# Patient Record
Sex: Male | Born: 1960
Health system: Southern US, Community
[De-identification: ages and names within clinical notes are randomized; demographics above are authoritative.]

## PROBLEM LIST (undated history)

## (undated) DIAGNOSIS — F329 Major depressive disorder, single episode, unspecified: Secondary | ICD-10-CM

## (undated) DIAGNOSIS — I10 Essential (primary) hypertension: Secondary | ICD-10-CM

## (undated) DIAGNOSIS — E785 Hyperlipidemia, unspecified: Secondary | ICD-10-CM

## (undated) DIAGNOSIS — H9190 Unspecified hearing loss, unspecified ear: Secondary | ICD-10-CM

## (undated) DIAGNOSIS — R972 Elevated prostate specific antigen [PSA]: Secondary | ICD-10-CM

## (undated) DIAGNOSIS — F1910 Other psychoactive substance abuse, uncomplicated: Secondary | ICD-10-CM

## (undated) DIAGNOSIS — J309 Allergic rhinitis, unspecified: Secondary | ICD-10-CM

## (undated) DIAGNOSIS — D126 Benign neoplasm of colon, unspecified: Secondary | ICD-10-CM

## (undated) DIAGNOSIS — F1021 Alcohol dependence, in remission: Secondary | ICD-10-CM

## (undated) DIAGNOSIS — R740 Nonspecific elevation of levels of transaminase and lactic acid dehydrogenase [LDH]: Secondary | ICD-10-CM

## (undated) HISTORY — DX: Alcohol dependence, in remission: F10.21

## (undated) HISTORY — DX: Hyperlipidemia, unspecified: E78.5

## (undated) HISTORY — DX: Nonspecific elevation of levels of transaminase and lactic acid dehydrogenase (ldh): R74.0

## (undated) HISTORY — DX: Other psychoactive substance abuse, uncomplicated: F19.10

## (undated) HISTORY — DX: Benign neoplasm of colon, unspecified: D12.6

## (undated) HISTORY — PX: COLONOSCOPY: SHX174

## (undated) HISTORY — DX: Unspecified hearing loss, unspecified ear: H91.90

## (undated) HISTORY — DX: Essential (primary) hypertension: I10

## (undated) HISTORY — DX: Major depressive disorder, single episode, unspecified: F32.9

## (undated) HISTORY — PX: POLYPECTOMY: SHX149

## (undated) HISTORY — DX: Allergic rhinitis, unspecified: J30.9

## (undated) HISTORY — DX: Elevated prostate specific antigen (PSA): R97.20

## (undated) HISTORY — PX: FOOT SURGERY: SHX648

---
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
## 2006-02-03 ENCOUNTER — Ambulatory Visit: Payer: Self-pay | Admitting: Internal Medicine

## 2006-06-03 ENCOUNTER — Ambulatory Visit: Payer: Self-pay | Admitting: Internal Medicine

## 2006-06-06 ENCOUNTER — Ambulatory Visit: Payer: Self-pay | Admitting: Internal Medicine

## 2006-06-16 ENCOUNTER — Ambulatory Visit: Payer: Self-pay | Admitting: Internal Medicine

## 2006-07-11 ENCOUNTER — Ambulatory Visit: Payer: Self-pay | Admitting: Internal Medicine

## 2006-09-25 ENCOUNTER — Ambulatory Visit: Payer: Self-pay | Admitting: Internal Medicine

## 2007-03-13 ENCOUNTER — Ambulatory Visit: Payer: Self-pay | Admitting: Internal Medicine

## 2007-03-13 LAB — CONVERTED CEMR LAB
ALT: 31 units/L (ref 0–40)
Direct LDL: 129.3 mg/dL
GFR calc non Af Amer: 77 mL/min
Glucose, Bld: 99 mg/dL (ref 70–99)
Total CHOL/HDL Ratio: 5.3
Triglycerides: 153 mg/dL — ABNORMAL HIGH (ref 0–149)

## 2007-05-22 ENCOUNTER — Ambulatory Visit: Payer: Self-pay | Admitting: Internal Medicine

## 2007-06-11 ENCOUNTER — Ambulatory Visit: Payer: Self-pay

## 2007-06-11 ENCOUNTER — Encounter: Payer: Self-pay | Admitting: Internal Medicine

## 2007-07-02 ENCOUNTER — Ambulatory Visit: Payer: Self-pay | Admitting: Internal Medicine

## 2007-07-02 LAB — CONVERTED CEMR LAB
AST: 26 units/L (ref 0–37)
BUN: 13 mg/dL (ref 6–23)
Chloride: 105 meq/L (ref 96–112)
Creatinine, Ser: 0.9 mg/dL (ref 0.4–1.5)
Direct LDL: 89.9 mg/dL
GFR calc Af Amer: 117 mL/min
HDL: 37.1 mg/dL — ABNORMAL LOW (ref >39.0)
Hgb A1c MFr Bld: 4.7 % (ref 4.6–6.0)
Sodium: 141 meq/L (ref 135–145)
T3, Free: 3.1 pg/mL (ref 2.3–4.2)
T4, Total: 7.2 ug/dL (ref 5.0–12.5)
TSH: 2.03 microintl units/mL (ref 0.35–5.50)
Total CHOL/HDL Ratio: 4.7
Triglycerides: 220 mg/dL (ref 0–149)
VLDL: 44 mg/dL — ABNORMAL HIGH (ref 0–40)

## 2007-07-28 DIAGNOSIS — F329 Major depressive disorder, single episode, unspecified: Secondary | ICD-10-CM

## 2007-07-28 DIAGNOSIS — F3289 Other specified depressive episodes: Secondary | ICD-10-CM

## 2007-07-28 DIAGNOSIS — F32A Depression, unspecified: Secondary | ICD-10-CM | POA: Insufficient documentation

## 2007-07-28 DIAGNOSIS — I10 Essential (primary) hypertension: Secondary | ICD-10-CM | POA: Insufficient documentation

## 2007-07-28 HISTORY — DX: Other specified depressive episodes: F32.89

## 2007-07-28 HISTORY — DX: Major depressive disorder, single episode, unspecified: F32.9

## 2007-07-28 HISTORY — DX: Essential (primary) hypertension: I10

## 2007-08-26 HISTORY — PX: OTHER SURGICAL HISTORY: SHX169

## 2007-08-29 ENCOUNTER — Emergency Department (HOSPITAL_COMMUNITY): Admission: EM | Admit: 2007-08-29 | Discharge: 2007-08-30 | Payer: Self-pay | Admitting: Emergency Medicine

## 2007-08-31 ENCOUNTER — Encounter: Payer: Self-pay | Admitting: Internal Medicine

## 2007-08-31 ENCOUNTER — Ambulatory Visit: Payer: Self-pay | Admitting: Internal Medicine

## 2007-09-03 ENCOUNTER — Encounter: Admission: RE | Admit: 2007-09-03 | Discharge: 2007-09-03 | Payer: Self-pay | Admitting: Internal Medicine

## 2007-09-11 ENCOUNTER — Ambulatory Visit: Payer: Self-pay | Admitting: Internal Medicine

## 2007-09-15 ENCOUNTER — Ambulatory Visit (HOSPITAL_BASED_OUTPATIENT_CLINIC_OR_DEPARTMENT_OTHER): Admission: RE | Admit: 2007-09-15 | Discharge: 2007-09-15 | Payer: Self-pay | Admitting: Orthopedic Surgery

## 2007-10-01 ENCOUNTER — Encounter: Payer: Self-pay | Admitting: Internal Medicine

## 2008-01-07 ENCOUNTER — Encounter: Payer: Self-pay | Admitting: Internal Medicine

## 2008-01-22 ENCOUNTER — Encounter: Payer: Self-pay | Admitting: Internal Medicine

## 2008-02-11 ENCOUNTER — Ambulatory Visit: Payer: Self-pay | Admitting: Internal Medicine

## 2008-02-11 DIAGNOSIS — E785 Hyperlipidemia, unspecified: Secondary | ICD-10-CM | POA: Insufficient documentation

## 2008-02-11 HISTORY — DX: Hyperlipidemia, unspecified: E78.5

## 2008-02-15 DIAGNOSIS — F1021 Alcohol dependence, in remission: Secondary | ICD-10-CM

## 2008-02-15 DIAGNOSIS — J309 Allergic rhinitis, unspecified: Secondary | ICD-10-CM

## 2008-02-15 HISTORY — DX: Allergic rhinitis, unspecified: J30.9

## 2008-02-15 HISTORY — DX: Alcohol dependence, in remission: F10.21

## 2008-04-15 ENCOUNTER — Ambulatory Visit: Payer: Self-pay | Admitting: Internal Medicine

## 2008-04-19 ENCOUNTER — Ambulatory Visit: Payer: Self-pay | Admitting: Internal Medicine

## 2008-04-19 LAB — CONVERTED CEMR LAB
ALT: 30 units/L (ref 0–53)
AST: 20 units/L (ref 0–37)
Cholesterol: 165 mg/dL (ref 0–200)
Creatinine, Ser: 0.9 mg/dL (ref 0.4–1.5)
Direct LDL: 88.3 mg/dL
GFR calc Af Amer: 116 mL/min
GFR calc non Af Amer: 96 mL/min
Potassium: 4.2 meq/L (ref 3.5–5.1)
Triglycerides: 204 mg/dL (ref 0–149)

## 2008-04-21 ENCOUNTER — Encounter: Payer: Self-pay | Admitting: Internal Medicine

## 2008-04-21 LAB — CONVERTED CEMR LAB: CRP, High Sensitivity: 5.2 — ABNORMAL HIGH

## 2008-04-25 ENCOUNTER — Encounter: Payer: Self-pay | Admitting: Internal Medicine

## 2008-08-05 ENCOUNTER — Ambulatory Visit: Payer: Self-pay | Admitting: Internal Medicine

## 2008-08-05 DIAGNOSIS — H9190 Unspecified hearing loss, unspecified ear: Secondary | ICD-10-CM | POA: Insufficient documentation

## 2008-08-05 HISTORY — DX: Unspecified hearing loss, unspecified ear: H91.90

## 2008-08-05 LAB — CONVERTED CEMR LAB
ALT: 39 U/L (ref 0–53)
AST: 25 U/L (ref 0–37)
Albumin: 4.2 g/dL (ref 3.5–5.2)
Alkaline Phosphatase: 72 U/L (ref 39–117)
Bilirubin, Direct: 0.1 mg/dL (ref 0.0–0.3)
Cholesterol: 121 mg/dL (ref 0–200)
Direct LDL: 65.4 mg/dL
HDL: 33.6 mg/dL — ABNORMAL LOW (ref >39.0)
PSA: 2.17 ng/mL (ref 0.10–4.00)
Total Bilirubin: 0.7 mg/dL (ref 0.3–1.2)
Total CHOL/HDL Ratio: 3.6
Total Protein: 7.4 g/dL (ref 6.0–8.3)
Triglycerides: 221 mg/dL (ref 0–149)
VLDL: 44 mg/dL — ABNORMAL HIGH (ref 0–40)

## 2008-09-09 ENCOUNTER — Ambulatory Visit: Payer: Self-pay | Admitting: Internal Medicine

## 2009-07-20 ENCOUNTER — Ambulatory Visit: Payer: Self-pay | Admitting: Internal Medicine

## 2009-07-21 ENCOUNTER — Encounter: Payer: Self-pay | Admitting: Internal Medicine

## 2009-07-21 LAB — CONVERTED CEMR LAB
Albumin: 3.9 g/dL (ref 3.5–5.2)
Alkaline Phosphatase: 77 units/L (ref 39–117)
BUN: 13 mg/dL (ref 6–23)
Basophils Absolute: 0 10*3/uL (ref 0.0–0.1)
Basophils Relative: 0.6 % (ref 0.0–3.0)
Calcium: 9.2 mg/dL (ref 8.4–10.5)
Cholesterol: 187 mg/dL (ref 0–200)
Direct LDL: 103.3 mg/dL
Eosinophils Absolute: 0.1 10*3/uL (ref 0.0–0.7)
GFR calc non Af Amer: 84.45 mL/min (ref >60)
Glucose, Bld: 100 mg/dL — ABNORMAL HIGH (ref 70–99)
HCT: 42 % (ref 39.0–52.0)
Hemoglobin, Urine: NEGATIVE
Hemoglobin: 14.9 g/dL (ref 13.0–17.0)
Leukocytes, UA: NEGATIVE
Lymphocytes Relative: 32.9 % (ref 12.0–46.0)
Lymphs Abs: 2.2 10*3/uL (ref 0.7–4.0)
MCHC: 35.3 g/dL (ref 30.0–36.0)
MCV: 94.7 fL (ref 78.0–100.0)
Monocytes Absolute: 0.6 10*3/uL (ref 0.1–1.0)
Neutro Abs: 3.8 10*3/uL (ref 1.4–7.7)
PSA: 3.82 ng/mL (ref 0.10–4.00)
Potassium: 4.1 meq/L (ref 3.5–5.1)
RBC: 4.44 M/uL (ref 4.22–5.81)
RDW: 13.3 % (ref 11.5–14.6)
Specific Gravity, Urine: >=1.03 (ref 1.000–1.030)
TSH: 2.86 microintl units/mL (ref 0.35–5.50)
Total Protein: 6.7 g/dL (ref 6.0–8.3)
Urine Glucose: NEGATIVE mg/dL
Urobilinogen, UA: 0.2 (ref 0.0–1.0)
VLDL: 44 mg/dL — ABNORMAL HIGH (ref 0.0–40.0)

## 2009-07-24 LAB — CONVERTED CEMR LAB: Hep A IgM: NEGATIVE

## 2009-09-01 ENCOUNTER — Ambulatory Visit: Payer: Self-pay | Admitting: Internal Medicine

## 2009-09-01 DIAGNOSIS — R7402 Elevation of levels of lactic acid dehydrogenase (LDH): Secondary | ICD-10-CM

## 2009-09-01 DIAGNOSIS — R7401 Elevation of levels of liver transaminase levels: Secondary | ICD-10-CM

## 2009-09-01 DIAGNOSIS — R740 Nonspecific elevation of levels of transaminase and lactic acid dehydrogenase [LDH]: Secondary | ICD-10-CM

## 2009-09-01 HISTORY — DX: Elevation of levels of lactic acid dehydrogenase (LDH): R74.02

## 2009-09-01 HISTORY — DX: Elevation of levels of liver transaminase levels: R74.01

## 2009-09-04 LAB — CONVERTED CEMR LAB
HCV Ab: NEGATIVE
Hep A IgM: NEGATIVE
Hep B C IgM: NEGATIVE

## 2010-09-03 ENCOUNTER — Encounter: Payer: Self-pay | Admitting: Internal Medicine

## 2010-09-14 ENCOUNTER — Encounter: Payer: Self-pay | Admitting: Internal Medicine

## 2010-09-24 ENCOUNTER — Ambulatory Visit: Payer: Self-pay | Admitting: Internal Medicine

## 2010-09-24 LAB — CONVERTED CEMR LAB
Albumin: 4.1 g/dL (ref 3.5–5.2)
Basophils Relative: 0.4 % (ref 0.0–3.0)
CO2: 29 meq/L (ref 19–32)
Chloride: 99 meq/L (ref 96–112)
Eosinophils Absolute: 0.1 10*3/uL (ref 0.0–0.7)
Glucose, Bld: 94 mg/dL (ref 70–99)
Hemoglobin, Urine: NEGATIVE
Hemoglobin: 15.3 g/dL (ref 13.0–17.0)
Leukocytes, UA: NEGATIVE
Lymphs Abs: 2.2 10*3/uL (ref 0.7–4.0)
MCHC: 35.2 g/dL (ref 30.0–36.0)
MCV: 93.1 fL (ref 78.0–100.0)
Monocytes Absolute: 0.5 10*3/uL (ref 0.1–1.0)
Neutro Abs: 5.5 10*3/uL (ref 1.4–7.7)
Nitrite: NEGATIVE
PSA: 4.28 ng/mL — ABNORMAL HIGH (ref 0.10–4.00)
RBC: 4.67 M/uL (ref 4.22–5.81)
RDW: 12.6 % (ref 11.5–14.6)
Sodium: 141 meq/L (ref 135–145)
TSH: 1.95 microintl units/mL (ref 0.35–5.50)
Total CHOL/HDL Ratio: 4
Total Protein: 6.8 g/dL (ref 6.0–8.3)
Triglycerides: 171 mg/dL — ABNORMAL HIGH (ref 0.0–149.0)
Urine Glucose: NEGATIVE mg/dL
Urobilinogen, UA: 0.2 (ref 0.0–1.0)

## 2010-10-01 ENCOUNTER — Encounter: Payer: Self-pay | Admitting: Internal Medicine

## 2010-10-01 ENCOUNTER — Ambulatory Visit: Payer: Self-pay | Admitting: Internal Medicine

## 2010-10-01 DIAGNOSIS — R972 Elevated prostate specific antigen [PSA]: Secondary | ICD-10-CM

## 2010-10-01 HISTORY — DX: Elevated prostate specific antigen (PSA): R97.20

## 2010-10-02 ENCOUNTER — Encounter (INDEPENDENT_AMBULATORY_CARE_PROVIDER_SITE_OTHER): Payer: Self-pay | Admitting: *Deleted

## 2010-10-25 ENCOUNTER — Encounter (INDEPENDENT_AMBULATORY_CARE_PROVIDER_SITE_OTHER): Payer: Self-pay | Admitting: *Deleted

## 2010-10-25 DIAGNOSIS — D126 Benign neoplasm of colon, unspecified: Secondary | ICD-10-CM

## 2010-10-25 HISTORY — DX: Benign neoplasm of colon, unspecified: D12.6

## 2010-10-26 ENCOUNTER — Ambulatory Visit: Payer: Self-pay | Admitting: Gastroenterology

## 2010-10-30 ENCOUNTER — Encounter: Payer: Self-pay | Admitting: Internal Medicine

## 2010-11-01 ENCOUNTER — Encounter: Payer: Self-pay | Admitting: Internal Medicine

## 2010-11-05 ENCOUNTER — Ambulatory Visit: Payer: Self-pay | Admitting: Gastroenterology

## 2010-11-05 LAB — HM COLONOSCOPY

## 2010-11-07 ENCOUNTER — Encounter: Payer: Self-pay | Admitting: Gastroenterology

## 2010-11-14 ENCOUNTER — Encounter: Payer: Self-pay | Admitting: Gastroenterology

## 2010-11-25 HISTORY — PX: KNEE ARTHROSCOPY, MEDIAL PATELLO FEMORAL LIGAMENT RECONSTRUCTION W/ HAMSTRING GRAFT: SHX1889

## 2010-11-29 ENCOUNTER — Encounter: Payer: Self-pay | Admitting: Internal Medicine

## 2010-12-06 ENCOUNTER — Encounter: Payer: Self-pay | Admitting: Internal Medicine

## 2010-12-07 ENCOUNTER — Encounter: Payer: Self-pay | Admitting: Internal Medicine

## 2010-12-17 ENCOUNTER — Encounter: Payer: Self-pay | Admitting: Internal Medicine

## 2010-12-27 NOTE — Letter (Signed)
Summary: Patient Notice- Polyp Results  [facility]  [address] [place], [place] [address]   Phone: [phone]  Fax: [phone]        November 14, 2010 MRN: [id number]    [facility] [address] [place], [place]  [address]    Dear Fred [name],  I am pleased to inform you that the colon polyp(s) removed during your recent colonoscopy was (were) found to be benign (no cancer detected) upon pathologic examination.  I recommend you have a repeat colonoscopy examination in 5_ years to look for recurrent polyps, as having colon polyps increases your risk for having recurrent polyps or even colon cancer in the future.  Should you develop new or worsening symptoms of abdominal pain, bowel habit changes or bleeding from the rectum or bowels, please schedule an evaluation with either your primary care physician or with me.  Additional information/recommendations:  _X_ No further action with gastroenterology is needed at this time. Please      follow-up with your primary care physician for your other healthcare      needs.  __ Please call [phone] to schedule a return visit to review your      situation.  __ Please keep your follow-up visit as already scheduled.  __ Continue treatment plan as outlined the day of your exam.  Please call us if you are having persistent problems or have questions about your condition that have not been fully answered at this time.  Sincerely,  Mardella Layman MD [facility]  This letter has been electronically signed by your physician.  Appended Document: Patient Notice- Polyp Results Letter mailed

## 2010-12-27 NOTE — Letter (Signed)
Summary: [facility] Instructions  [facility]  [address] [place], [place] [address]   Phone: [phone]  Fax: [phone]       Fred [name]    1961/07/02    MRN: [id number]        Procedure Day /Date:  Monday 11/05/2010     Arrival Time: 12:30 pm      Procedure Time: 1:30 pm     Location of Procedure:                    _x _  [facility] (4th Floor)                        PREPARATION FOR COLONOSCOPY WITH MOVIPREP   Starting 5 days prior to your procedure Wednesday 12/7 do not eat nuts, seeds, popcorn, corn, beans, peas,  salads, or any raw vegetables.  Do not take any fiber supplements (e.g. Metamucil, Citrucel, and Benefiber).  THE DAY BEFORE YOUR PROCEDURE         DATE: Sunday 12/11  1.  Drink clear liquids the entire day-NO SOLID FOOD  2.  Do not drink anything colored red or purple.  Avoid juices with pulp.  No orange juice.  3.  Drink at least 64 oz. (8 glasses) of fluid/clear liquids during the day to prevent dehydration and help the prep work efficiently.  CLEAR LIQUIDS INCLUDE: Water Jello Ice Popsicles Tea (sugar ok, no milk/cream) Powdered fruit flavored drinks Coffee (sugar ok, no milk/cream) Gatorade Juice: apple, white grape, white cranberry  Lemonade Clear bullion, consomm, broth Carbonated beverages (any kind) Strained chicken noodle soup Hard Candy                             4.  In the morning, mix first dose of MoviPrep solution:    Empty 1 Pouch A and 1 Pouch B into the disposable container    Add lukewarm drinking water to the top line of the container. Mix to dissolve    Refrigerate (mixed solution should be used within 24 hrs)  5.  Begin drinking the prep at 5:00 p.m. The MoviPrep container is divided by 4 marks.   Every 15 minutes drink the solution down to the next mark (approximately 8 oz) until the full liter is complete.   6.  Follow completed prep with 16 oz of clear liquid of your choice  (Nothing red or purple).  Continue to drink clear liquids until bedtime.  7.  Before going to bed, mix second dose of MoviPrep solution:    Empty 1 Pouch A and 1 Pouch B into the disposable container    Add lukewarm drinking water to the top line of the container. Mix to dissolve    Refrigerate  THE DAY OF YOUR PROCEDURE      DATE: Monday 12/12  Beginning at 8:30 a.m. (5 hours before procedure):         1. Every 15 minutes, drink the solution down to the next mark (approx 8 oz) until the full liter is complete.  2. Follow completed prep with 16 oz. of clear liquid of your choice.    3. You may drink clear liquids until 11:30 am (2 HOURS BEFORE PROCEDURE).   MEDICATION INSTRUCTIONS  Unless otherwise instructed, you should take regular prescription medications with a small sip of water   as early as possible the morning of  your procedure.         OTHER INSTRUCTIONS  You will need a responsible adult at least 51 years of age to accompany you and drive you home.   This person must remain in the waiting room during your procedure.  Wear loose fitting clothing that is easily removed.  Leave jewelry and other valuables at home.  However, you may wish to bring a book to read or  an iPod/MP3 player to listen to music as you wait for your procedure to start.  Remove all body piercing jewelry and leave at home.  Total time from sign-in until discharge is approximately 2-3 hours.  You should go home directly after your procedure and rest.  You can resume normal activities the  day after your procedure.  The day of your procedure you should not:   Drive   Make legal decisions   Operate machinery   Drink alcohol   Return to work  You will receive specific instructions about eating, activities and medications before you leave.    The above instructions have been reviewed and explained to me by   Karl Bales RN  October 26, 2010 9:21 AM    I fully understand  and can verbalize these instructions _____________________________ Date _________

## 2010-12-27 NOTE — Letter (Signed)
Summary: Surgical Clearance / [facility]  Surgical Clearance / [facility]   Imported By: Lennie Odor 12/20/2010 14:25:31  _____________________________________________________________________  External Attachment:    Type:   Image     Comment:   External Document

## 2010-12-27 NOTE — Letter (Signed)
Summary: Pre Visit Letter Revised  [facility]  [address] [place], [place] [address]   Phone: [phone]  Fax: [phone]        10/02/2010 MRN: [id number] Fred [name] [address] [place], [place]  [address]             Procedure Date:  11/05/2010   Welcome to the Gastroenterology Division at [facility].    You are scheduled to see a nurse for your pre-procedure visit on 10/26/2010 at 9:00AM on the 3rd floor at [facility], [address]  We ask that you try to arrive at our office 15 minutes prior to your appointment time to allow for check-in.  Please take a minute to review the attached form.  If you answer "Yes" to one or more of the questions on the first page, we ask that you call the person listed at your earliest opportunity.  If you answer "No" to all of the questions, please complete the rest of the form and bring it to your appointment.    Your nurse visit will consist of discussing your medical and surgical history, your immediate family medical history, and your medications.   If you are unable to list all of your medications on the form, please bring the medication bottles to your appointment and we will list them.  We will need to be aware of both prescribed and over the counter drugs.  We will need to know exact dosage information as well.    Please be prepared to read and sign documents such as consent forms, a financial agreement, and acknowledgement forms.  If necessary, and with your consent, a friend or relative is welcome to sit-in on the nurse visit with you.  Please bring your insurance card so that we may make a copy of it.  If your insurance requires a referral to see a specialist, please bring your referral form from your primary care physician.  No co-pay is required for this nurse visit.     If you cannot keep your appointment, please call [phone] to cancel or reschedule prior to your appointment date.  This allows  [place] the opportunity to schedule an appointment for another patient in need of care.    Thank you for choosing [facility] for your medical needs.  We appreciate the opportunity to care for you.  Please visit [place] at our website  to learn more about our practice.  Sincerely, The Gastroenterology Division

## 2010-12-27 NOTE — Op Note (Signed)
Summary: Leonides Grills MD  Leonides Grills MD   Imported By: Lester Buckner 09/19/2010 08:47:14  _____________________________________________________________________  External Attachment:    Type:   Image     Comment:   External Document

## 2010-12-27 NOTE — Op Note (Signed)
Summary: [facility]   Imported By: Sherian Rein 12/06/2010 10:53:08  _____________________________________________________________________  External Attachment:    Type:   Image     Comment:   External Document

## 2010-12-27 NOTE — Procedures (Signed)
Summary: Colonoscopy  Patient: Fred [name] Note: All result statuses are Final unless otherwise noted.  Tests: (1) Colonoscopy (COL)   COL Colonoscopy           DONE     [facility]     [address]     [place], [place]  [address]           COLONOSCOPY PROCEDURE REPORT           PATIENT:  [name], Fred  MR#:  [id number]     BIRTHDATE:  1961/04/08, 49 yrs. old  GENDER:  male     ENDOSCOPIST:  Vania Rea. Jarold Motto, MD, [facility]     REF. BY:  Oliver Barre, M.D.     PROCEDURE DATE:  11/05/2010     PROCEDURE:  Colonoscopy with snare polypectomy     ASA CLASS:  Class II     INDICATIONS:  Routine Risk Screening     MEDICATIONS:   Fentanyl 75 mcg IV, Versed 7 mg           DESCRIPTION OF PROCEDURE:   After the risks benefits and     alternatives of the procedure were thoroughly explained, informed     consent was obtained.  Digital rectal exam was performed and     revealed no abnormalities.   The LB CF-[id number] endoscope     was introduced through the anus and advanced to the cecum, which     was identified by both the appendix and ileocecal valve, without     limitations.  The quality of the prep was good, using MoviPrep.     The instrument was then slowly withdrawn as the colon was fully     examined.     <<PROCEDUREIMAGES>>           FINDINGS:  A sessile polyp was found in the cecum. 6mm flat cecal     polyp hot snare excised.  A diminutive polyp was found at the     hepatic flexure. CAUTERIZED.NO TISSUE.SMALL POLYP AT HEPATIC     FLEXURE.  This was otherwise a normal examination of the colon.     Retroflexed views in the rectum revealed no abnormalities.    The     scope was then withdrawn from the patient and the procedure     completed.           COMPLICATIONS:  None     ENDOSCOPIC IMPRESSION:     1) Diminutive polyp at the hepatic flexure     2) Otherwise normal examination     3) Cecal polyp     RECOMMENDATIONS:     1) Repeat Colonoscopy in 5 years.  REPEAT EXAM:  No           ______________________________     Vania Rea. Jarold Motto, MD, [facility]           CC:           n.     eSIGNED:   Vania Rea. Patterson at 11/05/2010 02:06 PM           Lamar Sprinkles, [id number]  Note: An exclamation mark (!) indicates a result that was not dispersed into the flowsheet. Document Creation Date: 11/05/2010 2:07 PM _______________________________________________________________________  (1) Order result status: Final Collection or observation date-time: 11/05/2010 13:56 Requested date-time:  Receipt date-time:  Reported date-time:  Referring Physician:   Ordering Physician: Sheryn Bison [phone]) Specimen Source:  Source: Launa Grill Order Number: [id number] Lab  site:   Appended Document: Colonoscopy 5Y F/U  Appended Document: Colonoscopy Recall     Procedures Next Due Date:    Colonoscopy: 11/2015  Appended Document: Colonoscopy 5Y F/U

## 2010-12-27 NOTE — Letter (Signed)
Summary: [facility]  [facility]   Imported By: Sherian Rein 11/08/2010 10:22:27  _____________________________________________________________________  External Attachment:    Type:   Image     Comment:   External Document

## 2010-12-27 NOTE — Consult Note (Signed)
Summary: [facility]  [facility]   Imported By: Lennie Odor 12/19/2010 15:20:18  _____________________________________________________________________  External Attachment:    Type:   Image     Comment:   External Document

## 2010-12-27 NOTE — Letter (Signed)
Summary: Patient Notice- Polyp Results  [facility]  [address] [place], [place] [address]   Phone: [phone]  Fax: [phone]        November 07, 2010 MRN: [id number]    [facility] [address] [place], [place]  [address]    Dear Mr. Tenpas,  I am pleased to inform you that the colon polyp(s) removed during your recent colonoscopy was (were) found to be benign (no cancer detected) upon pathologic examination.  I recommend you have a repeat colonoscopy examination in 5_ years to look for recurrent polyps, as having colon polyps increases your risk for having recurrent polyps or even colon cancer in the future.  Should you develop new or worsening symptoms of abdominal pain, bowel habit changes or bleeding from the rectum or bowels, please schedule an evaluation with either your primary care physician or with me.  Additional information/recommendations:  _X_ No further action with gastroenterology is needed at this time. Please      follow-up with your primary care physician for your other healthcare      needs.  __ Please call [phone] to schedule a return visit to review your      situation.  __ Please keep your follow-up visit as already scheduled.  __ Continue treatment plan as outlined the day of your exam.  Please call us if you are having persistent problems or have questions about your condition that have not been fully answered at this time.  Sincerely,  Mardella Layman MD [facility]  This letter has been electronically signed by your physician.  Appended Document: Patient Notice- Polyp Results Letter Mailed

## 2010-12-27 NOTE — Consult Note (Signed)
Summary: [facility]   [facility]   Imported By: Sherian Rein 11/05/2010 08:52:18  _____________________________________________________________________  External Attachment:    Type:   Image     Comment:   External Document

## 2010-12-27 NOTE — Miscellaneous (Signed)
Summary: [facility] previsit  Clinical Lists Changes  Medications: Added new medication of MOVIPREP 100 GM  SOLR (PEG-KCL-NACL-NASULF-NA ASC-C) As per prep instructions. - Signed Rx of MOVIPREP 100 GM  SOLR (PEG-KCL-NACL-NASULF-NA ASC-C) As per prep instructions.;  #1 x 0;  Signed;  Entered by: Karl Bales RN;  Authorized by: Mardella Layman MD [facility];  Method used: Electronically to [facility]. [place]. [phone]*, [address], [place], [place], [place]  [address], Ph: [phone] or [phone], Fax: [phone] Observations: Added new observation of NKA: T (10/26/2010 8:56)    Prescriptions: MOVIPREP 100 GM  SOLR (PEG-KCL-NACL-NASULF-NA ASC-C) As per prep instructions.  #1 x 0   Entered by:   Karl Bales RN   Authorized by:   Mardella Layman MD [facility]   Signed by:   Karl Bales RN on 10/26/2010   Method used:   Electronically to        [facility] [address] [phone]* (retail)       [address]       [place], [place]  [address]       Ph: [phone] or [phone]       Fax: [phone]   RxID:   [id number]

## 2010-12-27 NOTE — Letter (Signed)
Summary: [facility]  [facility]   Imported By: Sherian Rein 09/06/2010 11:58:04  _____________________________________________________________________  External Attachment:    Type:   Image     Comment:   External Document

## 2010-12-27 NOTE — Assessment & Plan Note (Signed)
Summary: cpx-lb   Vital Signs:  Patient profile:   50 year old male Weight:      195 pounds BMI:     25.82 Temp:     98.1 degrees F Pulse rate:   88 / minute BP sitting:   142 / 92  (left arm) Cuff size:   regular  Vitals Entered By: Lamar Sprinkles, CMA (October 01, 2010 12:58 PM)  CC: CPX   CC:  CPX.  History of Present Illness: here for wellness; overall doing well;  Pt denies CP, worsening sob, doe, wheezing, orthopnea, pnd, worsening LE edema, palps, dizziness or syncope  Pt denies new neuro symptoms such as headache, facial or extremity weakness  Pt denies polydipsia, polyuria Overall good compliance with meds, trying to follow low chol  diet, wt stable, little excercise however . No fever, wt loss, night sweats, loss of appetite or other constitutional symptoms  Denies worsening depressive symptoms, suicidal ideation, or panic.   Pt states good ability with ADL's, low fall risk, home safety reviewed and adequate, no significant change in hearing or vision, trying to follow lower chol diet, and occasionally active only with regular excercise. BP at home similar to today.    Preventive Screening-Counseling & Management      Drug Use:  no.    Problems Prior to Update: 1)  Psa, Increased  (ICD-790.93) 2)  Transaminases, Serum, Elevated  (ICD-790.4) 3)  Preventive Health Care  (ICD-V70.0) 4)  Hepatotoxicity, Drug-induced, Risk of  (ICD-V58.69) 5)  Hearing Loss  (ICD-389.9) 6)  Special Screening Malig Neoplasms Other Sites  (ICD-V76.49) 7)  Hyperlipidemia  (ICD-272.4) 8)  Hypertension  (ICD-401.9) 9)  Allergic Rhinitis  (ICD-477.9) 10)  Alcohol Abuse, Episodic, Hx of  (ICD-V11.3) 11)  Depression  (ICD-311)  Medications Prior to Update: 1)  Crestor 20 Mg  Tabs (Rosuvastatin Calcium) .... One By Mouth Qpm 2)  Wellbutrin Xl 300 Mg  Tb24 (Bupropion Hcl) .... Once Daily 3)  Remeron 45 Mg  Tabs (Mirtazapine) .... Once Daily 4)  Benazepril Hcl 20 Mg  Tabs (Benazepril Hcl) ....  Once Daily- No Addtnl Refills Pt Overdue For An Appt 5)  Fish Oil 1000 Mg  Caps (Omega-3 Fatty Acids) .... Take 1 Tablet By Mouth Once A Day 6)  Carvedilol 3.125 Mg  Tabs (Carvedilol) .... Take 1 Tablet By Mouth Two Times A Day  Current Medications (verified): 1)  Crestor 20 Mg  Tabs (Rosuvastatin Calcium) .... One By Mouth Qpm 2)  Wellbutrin Xl 300 Mg  Tb24 (Bupropion Hcl) .... Once Daily 3)  Remeron 45 Mg  Tabs (Mirtazapine) .... Once Daily 4)  Benazepril Hcl 20 Mg  Tabs (Benazepril Hcl) .Marland Kitchen.. 1po Once Daily 5)  Fish Oil 1000 Mg  Caps (Omega-3 Fatty Acids) .... Take 1 Tablet By Mouth Once A Day 6)  Carvedilol 6.25 Mg Tabs (Carvedilol) .Marland Kitchen.. 1po Two Times A Day  Allergies (verified): No Known Drug Allergies  Past History:  Past Medical History: Last updated: 09/01/2009 Depression Hypertension Hyperlipidemia hx of ETOH - remote glucose intolerance - better with wt loss  Past Surgical History: Last updated: 08/05/2008 Left foot surgery 08/2007. Inguinal herniorrhaphy - 1976  Family History: Last updated: 10/01/2010 His father is age 69 with hypertension.  Mother is age 60 and has a history of depression and alcoholism.  The patient has 2 siblings with no significant medical problems.  Grandmother diagnosed with colon polyps at age 89.  father with prostate cancer, PVD with ileac aneurysm -  followed by the [facility]  Social History: Last updated: 10/01/2010 Never Smoked Alcohol use-no Single/homosexual Occupation:  IT trainer - health care finance no children Drug use-no  Risk Factors: Smoking Status: never (09/01/2009)  Family History: Reviewed history from 02/11/2008 and no changes required. His father is age 76 with hypertension.  Mother is age 26 and has a history of depression and alcoholism.  The patient has 2 siblings with no significant medical problems.  Grandmother diagnosed with colon polyps at age 59.  father with prostate cancer, PVD with ileac aneurysm -  followed by the [facility]  Social History: Reviewed history from 08/05/2008 and no changes required. 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         all otherwise negative per pt -    Physical Exam  General:  alert and overweight-appearing.   Head:  normocephalic and atraumatic.   Eyes:  vision grossly intact, pupils equal, and pupils round.   Ears:  R ear normal and L ear normal.   Nose:  no external deformity and no nasal discharge.   Mouth:  no gingival abnormalities and pharynx pink and moist.   Neck:  supple and no masses.   Lungs:  normal respiratory effort and normal breath sounds.   Heart:  normal rate and regular rhythm.   Abdomen:  soft, non-tender, and normal bowel sounds.   Msk:  no joint tenderness and no joint swelling.   Extremities:  no edema, no erythema  Neurologic:  cranial nerves II-XII intact and strength normal in all extremities.     Impression & Recommendations:  Problem # 1:  Preventive Health Care (ICD-V70.0) Overall doing well, age appropriate education and counseling updated, referral for preventive services and immunizations addressed, dietary counseling and smoking status adressed , most recent labs reviewed I have personally reviewed and have noted 1.The patient's medical and social history 2.Their use of alcohol, tobacco or illicit drugs 3.Their current medications and supplements 4. Functional ability  including ADL's, fall risk, home safety risk, hearing & visual impairment  5.Diet and physical activities 6.Evidence for depression or mood disorders The patients weight, height, BMI  have been recorded in the chart I have made referrals, counseling and provided education to the patient based review of the above  Orders: Gastroenterology Referral (GI)  Problem # 2:  PSA, INCREASED (ICD-790.93)  refer urology   Orders: Urology Referral (Urology)  Problem # 3:  HYPERTENSION (ICD-401.9)  His updated medication list for this problem includes:    Benazepril Hcl 20 Mg Tabs (Benazepril hcl) .Marland Kitchen... 1po once daily    Carvedilol 6.25 Mg Tabs (Carvedilol) .Marland Kitchen... 1po two times a day  BP today: 142/92 Prior BP: 128/84 (09/01/2009)  Labs Reviewed: K+: 4.4 (09/24/2010) Creat: : 1.0 (09/24/2010)   Chol: 170 (09/24/2010)   HDL: 38.40 (09/24/2010)   LDL: 97 (09/24/2010)   TG: 171.0 (09/24/2010) uncontrolled - to increase the  coreg  Problem # 4:  HYPERLIPIDEMIA (ICD-272.4)  His updated medication list for this problem includes:    Crestor 20 Mg Tabs (Rosuvastatin calcium) ..... One by mouth qpm  Labs  Reviewed: SGOT: 22 (09/24/2010)   SGPT: 29 (09/24/2010)   HDL:38.40 (09/24/2010), 45.10 (07/20/2009)  LDL:97 (09/24/2010), DEL (03/47/4259)  Chol:170 (09/24/2010), 187 (07/20/2009)  Trig:171.0 (09/24/2010), 220.0 (07/20/2009) stable overall by hx and exam, ok to continue meds/tx as is   Complete Medication List: 1)  Crestor 20 Mg Tabs (Rosuvastatin calcium) .... One by mouth qpm 2)  Wellbutrin Xl 300 Mg Tb24 (Bupropion hcl) .... Once daily 3)  Remeron 45 Mg Tabs (Mirtazapine) .... Once daily 4)  Benazepril Hcl 20 Mg Tabs (Benazepril hcl) .Marland Kitchen.. 1po once daily 5)  Fish Oil 1000 Mg Caps (Omega-3 fatty acids) .... Take 1 tablet by mouth once a day 6)  Carvedilol 6.25 Mg Tabs (Carvedilol) .Marland Kitchen.. 1po two times a day  Patient Instructions: 1)  You will be contacted about the referral(s) to: urology,  and colonscopy 2)  increase the carvedilol to 6.25 mg two times a day  3)  Continue all other previous medications as before this visit  4)  All your medications were sent to the pharmacy 5)  Please schedule a follow-up appointment in 1 year, or sooner if needed 6)  Check your Blood Pressure regularly. If it is above 140/90: you should make an appointment. Prescriptions: CRESTOR 20 MG  TABS (ROSUVASTATIN CALCIUM) one by mouth qpm  #90 x 3   Entered and Authorized by:   Corwin Levins MD   Signed by:   Corwin Levins MD on 10/01/2010   Method used:   Electronically to        [facility] [address] [phone]* (retail)       [address]       [place], [place]  [address]       Ph: [phone] or [phone]       Fax: [phone]   RxID:   [id number] CARVEDILOL 6.25 MG TABS (CARVEDILOL) 1po two times a day  #180 x 3   Entered and Authorized by:   Corwin Levins MD   Signed by:   Corwin Levins MD on 10/01/2010   Method used:   Electronically to        [facility]. [address] [phone]* (retail)       [address]       [place], [place]  [address]       Ph: [phone] or [phone]       Fax: [phone]   RxID:   [id number] BENAZEPRIL HCL 20 MG  TABS (BENAZEPRIL HCL) 1po once daily  #90 x 3   Entered and Authorized by:   Corwin Levins MD   Signed by:   Corwin Levins MD on 10/01/2010   Method used:   Electronically to        [facility]. [address] [phone]* (retail)       [address]       [place], [place]  [address]       Ph: [phone] or [phone]       Fax: [phone]   RxID:   [id number]    Orders Added: 1)  Gastroenterology Referral [GI] 2)  Urology Referral [Urology] 3)  Est. Patient 40-64 years [phone]

## 2010-12-31 ENCOUNTER — Encounter (HOSPITAL_COMMUNITY): Payer: BC Managed Care – PPO

## 2010-12-31 DIAGNOSIS — M1710 Unilateral primary osteoarthritis, unspecified knee: Secondary | ICD-10-CM | POA: Insufficient documentation

## 2010-12-31 LAB — BASIC METABOLIC PANEL
BUN: 12 mg/dL (ref 6–23)
CO2: 28 mEq/L (ref 19–32)
Chloride: 106 mEq/L (ref 96–112)
Potassium: 4.2 mEq/L (ref 3.5–5.1)

## 2010-12-31 LAB — CBC
Hemoglobin: 14.5 g/dL (ref 13.0–17.0)
MCH: 30.4 pg (ref 26.0–34.0)
MCV: 89.5 fL (ref 78.0–100.0)
Platelets: 238 10*3/uL (ref 150–400)
RBC: 4.77 MIL/uL (ref 4.22–5.81)
WBC: 7.7 10*3/uL (ref 4.0–10.5)

## 2010-12-31 LAB — SURGICAL PCR SCREEN
MRSA, PCR: NEGATIVE
Staphylococcus aureus: NEGATIVE

## 2011-01-04 ENCOUNTER — Ambulatory Visit (HOSPITAL_COMMUNITY)
Admission: RE | Admit: 2011-01-04 | Discharge: 2011-01-04 | Disposition: A | Discharge status: Home / Self Care | Payer: BC Managed Care – PPO | Attending: Specialist | Admitting: Specialist

## 2011-01-04 DIAGNOSIS — S83289A Other tear of lateral meniscus, current injury, unspecified knee, initial encounter: Secondary | ICD-10-CM | POA: Insufficient documentation

## 2011-01-04 DIAGNOSIS — Z01812 Encounter for preprocedural laboratory examination: Secondary | ICD-10-CM | POA: Insufficient documentation

## 2011-01-04 DIAGNOSIS — M2240 Chondromalacia patellae, unspecified knee: Secondary | ICD-10-CM | POA: Insufficient documentation

## 2011-01-04 DIAGNOSIS — M1710 Unilateral primary osteoarthritis, unspecified knee: Secondary | ICD-10-CM | POA: Insufficient documentation

## 2011-01-04 DIAGNOSIS — X58XXXA Exposure to other specified factors, initial encounter: Secondary | ICD-10-CM | POA: Insufficient documentation

## 2011-01-08 NOTE — Op Note (Signed)
NAMENOEL, [name]               ACCOUNT NO.:  [id number]  MEDICAL RECORD NO.:  [id number]           PATIENT TYPE:  O  LOCATION:  [facility]M                         FACILITY:  [facility]  PHYSICIAN:  Jene Every, M.D.    DATE OF BIRTH:  Jan 03, 1961  DATE OF PROCEDURE:  01/04/2011 DATE OF DISCHARGE:  12/31/2010                              OPERATIVE REPORT   PREOPERATIVE DIAGNOSIS:  Degenerative joint disease left knee.  POSTOPERATIVE DIAGNOSES: 1. 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 2. Chondral flap tears of the medial femoral condyle, lateral femoral     condyle. 3. Lateral meniscus tear. 4. Chondromalacia of the patellofemoral joint.  PROCEDURE PERFORMED: 1. Left knee arthroscopy. 2. Excision of chondral flap tears of the medial and lateral femoral     condyle. 3. Chondroplasty of the medial and lateral femoral condyle. 4. Evacuation of loose bodies. 5. Partial lateral meniscectomy.  ANESTHESIA:  General  ASSISTANT:  None.  BRIEF HISTORY:  [age]-year-old with locking knee pain, giving way, patellofemoral pain, chondromalacia noted on the MRI, possible meniscal pathology, refractory conservative treatment, unable to hike, was indicated for arthroscopic evaluation, chondroplasty, evacuation of loose bodies.  Risk and benefits were discussed including bleeding, infection, damage, neurovascular accident, no change in symptoms, worsening of symptoms, need for repeat debridement, DVT, PE, anesthetic complications etc.,  PROCEDURE IN DETAIL:  The patient was placed in supine position and after adequate general anesthesia and 2 grams Kefzol, the left lower extremity was prepped and draped in the usual sterile fashion.  A lateral parapatellar portal and superomedial parapatellar portal was fashioned with a #11 blade.  Ingress cannula was atraumatically placed. Irrigant was utilized to insufflate joint.  Under direct  visualization, a medial parapatellar portal was fashioned with a #11 blade after localization with 18 gauge needle sparing the medial meniscus.  There was some central grade III changes in the patellofemoral joint, normal patellofemoral, tracking of the medial compartment was grade III lesion in the posterior weightbearing aspect of the medial femoral condyle and chondral flap tears and loose cartilaginous debris.  The meniscus was unremarkable.  I introduced a basket, rongeur and excised the perimeter of the lesion measuring 2 x 2 cm.  Light chondroplasty was performed and I evacuated loose bodies with the shaver.  The meniscus was stable on probe palpation without evidence of tear.  ACL and PCL were unremarkable.  Lateral compartment revealed radial tear and lateral meniscus, there was a chondral flap tear and grade III lesion of the femoral condyle and weightbearing surface approximately 1.5 x 1.5 cm.  Again, I excised the loose flap tears of the periphery of the lesion, further contoured it with a light chondroplasty.  There was some cartilage left overlying the bone.  No grade IV lesion.  I shaved the lateral meniscus and performed a partial lateral meniscectomy to a stable base, 20% of the lateral portion of the meniscus was removed with the shaver.  I evacuated the loose bodies from this compartment as well.  ACL and PCL are unremarkable.  Normal patellofemoral tracking.  Gutters  were unremarkable.  I re- examined all compartments and no further pathology amenable for arthroscopic intervention and therefore removed all instrumentation. Portals were closed with 4-0 nylon simple sutures, 0.25% Marcaine with epinephrine was infiltrated.  The joint was dressed sterilely without difficulty and transported to the recovery room in satisfactory condition.  The patient tolerated the procedure well.  COMPLICATIONS:  None.     Jene Every, M.D.     Cordelia Pen  D:  01/04/2011   T:  01/04/2011  Job:  [id number]  Electronically Signed by Jene Every M.D. on 01/08/2011 12:17:14 PM

## 2011-04-09 NOTE — Op Note (Signed)
Fred [name], Fred [name]               ACCOUNT NO.:  [id number]   MEDICAL RECORD NO.:  [id number]          PATIENT TYPE:  AMB   LOCATION:  DSC                          FACILITY:  [facility]   PHYSICIAN:  Leonides Grills, M.D.     DATE OF BIRTH:  December 25, 1960   DATE OF PROCEDURE:  09/15/2007  DATE OF DISCHARGE:                               OPERATIVE REPORT   PREOPERATIVE DIAGNOSIS:  Left first and second tarsometatarsal joint  ligament rupture, i.e., Lisfranc inferior ligamentous tear.   POSTOPERATIVE DIAGNOSIS:  Left first and second tarsometatarsal joint  ligament rupture, i.e., Lisfranc inferior ligamentous tear.   OPERATION:  1. Open reduction and internal fixation, left first and second      tarsometatarsal joint.  2. Stress x-rays, left foot.   ANESTHESIA:  General.   SURGEON:  Leonides Grills, M.D.   ASSISTANT:  Evlyn Kanner, P.A.   ESTIMATED BLOOD LOSS:  Minimal.   TOURNIQUET TIME:  Approximately an hour.   COMPLICATIONS:  None.   DISPOSITION:  Stable to PAR.   INDICATIONS:  This is a 50 year old male who sustained the above injury.  Due to the fact that it is a pure ligamentous injury, he was told that  these typically do not have a very good outcome in long-term studies.  I  explained to him the risks of surgery, which include infection, nerve or  vessel injury, wound problems, persistent pain, worsening pain,  prolonged recovery, stiffness, arthritis and possible future fusion; the  postoperative protocol, which would include 3 months nonweightbearing  and 2 months in a Cam walker boot and hardware removal at the end of 5  months with the possibility of hardware irritation and hardware failure  and retained hardware, were all explained, questions were encouraged and  answered.   OPERATION:  The patient brought to the operating room and placed in  supine position after adequate general endotracheal tube anesthesia was  administered as well as Ancef 1 g IV piggyback.  A  pump was placed in  the left ipsilateral hip, internally rotating the left lower extremity,  and the left lower extremity was then prepped and draped in a sterile  manner over a proximally-placed thigh tourniquet.  The limb was gravity-  exsanguinated and the tourniquet was elevated to 290 mmHg.  A  longitudinal incision midline between EHL and EHB was then made.  Dissection was carried down through skin.  Hemostasis was obtained.  The  superficial peroneal nerve was identified and protected out of harm's  way.  EHL and EDB tendons were identified and protected within their  sheaths as well.  Interval between the EHL and EHB was then developed.  Sharp dissection was carried down and the ligament of the dorsal aspect  of the first and second TMT joints were elevated, exposing both joints  respectively.  The intercuneiform joint was inspected and stressed under  x-ray guidance and showed no gross motion and did not require any  fixation.  Once this was identified, we then proceeded to fix the first  TMT joint.  This was then anatomically reduced after the joint  was  cleared of any hematoma or scar tissue or intervening ligament.  Once  this was anatomically reduced, a 1.25 K-wire was then placed to  provisionally fix this.  Once this was in place, we then placed a two-  point reduction clamp on the other side of the joint, compressing across  the joint.  Once this was done, we then used a bur to create a notch  approximately 2 cm distal to the first TMT joint at the base of the  first metatarsal.  We then placed a 4.0-mm fully-threaded cortical set  screw using a 2.9-mm drill hole, respectively.  This had excellent  purchase and maintenance of the compression across the joint.  This was  a pure set screw.  We then went in the Lisfranc ligament region and  removed any intervening hematoma and ligament.  We then anatomically  reduced the second TMT joint Lisfranc region with a two-point  reduction  clamp.  This was verified under C-arm guidance to be in an anatomic  position.  We then through a separate stab wound for the nick and spread  technique down to the medial cuneiform placed a 4.0-mm fully-threaded  cortical set screw using a 2.9-mm drill hole, respectively.  This had  excellent purchase and maintenance of the compression across the joint.  Final stress x-rays were obtained in AP, lateral and oblique planes and  showed no gross motion across the fixation sites, fixation in proper  position and excellent alignment as well.  When compared to the  preoperative x-rays of the contralateral side, the space between the  base of the first and second TMT joints was symmetric.  The tourniquet  was deflated.  Hemostasis was obtained.  The deep peroneal nerve was  identified and retracted and the deep ligamentous structures over the  dorsal aspect of the first TMT joint was closed with 3-0 Vicryl.  There  was no pulsatile bleeding.  There was a palpable dorsalis pedis pulse at  the end.  Hemostasis was obtained.  The wound was copiously irrigated  with normal saline.  The subcu was closed with 3-0 Vicryl.  Skin was  closed with 4-0 nylon.  A sterile dressing was applied.  A modified  Jones dressing was applied.  The patient was stable to the PAR.      Leonides Grills, M.D.  Electronically Signed     PB/MEDQ  D:  09/15/2007  T:  09/16/2007  Job:  [id number]

## 2011-07-31 ENCOUNTER — Telehealth: Payer: Self-pay | Admitting: Internal Medicine

## 2011-07-31 DIAGNOSIS — Z0000 Encounter for general adult medical examination without abnormal findings: Secondary | ICD-10-CM

## 2011-07-31 DIAGNOSIS — Z1289 Encounter for screening for malignant neoplasm of other sites: Secondary | ICD-10-CM

## 2011-07-31 NOTE — Telephone Encounter (Signed)
Put order in for labs. 

## 2011-09-04 LAB — BASIC METABOLIC PANEL
BUN: 17
Chloride: 106
Creatinine, Ser: 1.18
Glucose, Bld: 87
Potassium: 4.1

## 2011-10-03 ENCOUNTER — Encounter: Payer: BC Managed Care – PPO | Admitting: Internal Medicine

## 2011-10-04 ENCOUNTER — Other Ambulatory Visit (INDEPENDENT_AMBULATORY_CARE_PROVIDER_SITE_OTHER): Payer: BC Managed Care – PPO

## 2011-10-04 DIAGNOSIS — Z1289 Encounter for screening for malignant neoplasm of other sites: Secondary | ICD-10-CM

## 2011-10-04 DIAGNOSIS — Z0000 Encounter for general adult medical examination without abnormal findings: Secondary | ICD-10-CM

## 2011-10-04 LAB — CBC WITH DIFFERENTIAL/PLATELET
Basophils Relative: 0.3 % (ref 0.0–3.0)
HCT: 41.5 % (ref 39.0–52.0)
Hemoglobin: 14.4 g/dL (ref 13.0–17.0)
Lymphocytes Relative: 33.3 % (ref 12.0–46.0)
Lymphs Abs: 2.5 10*3/uL (ref 0.7–4.0)
MCHC: 34.5 g/dL (ref 30.0–36.0)
Monocytes Relative: 7.3 % (ref 3.0–12.0)
Neutro Abs: 4.2 10*3/uL (ref 1.4–7.7)
RBC: 4.55 Mil/uL (ref 4.22–5.81)

## 2011-10-04 LAB — URINALYSIS, ROUTINE W REFLEX MICROSCOPIC
Hgb urine dipstick: NEGATIVE
Nitrite: NEGATIVE
Specific Gravity, Urine: 1.025 (ref 1.000–1.030)
Urobilinogen, UA: 0.2 (ref 0.0–1.0)

## 2011-10-04 LAB — LIPID PANEL
LDL Cholesterol: 69 mg/dL (ref 0–99)
Total CHOL/HDL Ratio: 3

## 2011-10-04 LAB — HEPATIC FUNCTION PANEL
ALT: 25 U/L (ref 0–53)
Albumin: 3.9 g/dL (ref 3.5–5.2)
Total Protein: 6.4 g/dL (ref 6.0–8.3)

## 2011-10-04 LAB — TSH: TSH: 2.16 u[IU]/mL (ref 0.35–5.50)

## 2011-10-04 LAB — BASIC METABOLIC PANEL
BUN: 14 mg/dL (ref 6–23)
CO2: 26 mEq/L (ref 19–32)
Chloride: 107 mEq/L (ref 96–112)
Creatinine, Ser: 1 mg/dL (ref 0.4–1.5)

## 2011-10-04 LAB — PSA: PSA: 3.47 ng/mL (ref 0.10–4.00)

## 2011-10-05 ENCOUNTER — Encounter: Payer: Self-pay | Admitting: Internal Medicine

## 2011-10-05 DIAGNOSIS — Z0000 Encounter for general adult medical examination without abnormal findings: Secondary | ICD-10-CM | POA: Insufficient documentation

## 2011-10-05 DIAGNOSIS — Z0001 Encounter for general adult medical examination with abnormal findings: Secondary | ICD-10-CM | POA: Insufficient documentation

## 2011-10-08 ENCOUNTER — Encounter: Payer: Self-pay | Admitting: Internal Medicine

## 2011-10-08 ENCOUNTER — Ambulatory Visit (INDEPENDENT_AMBULATORY_CARE_PROVIDER_SITE_OTHER): Payer: BC Managed Care – PPO | Admitting: Internal Medicine

## 2011-10-08 VITALS — BP 100/70 | HR 65 | Temp 98.1°F | Ht 73.0 in | Wt 201.0 lb

## 2011-10-08 DIAGNOSIS — I10 Essential (primary) hypertension: Secondary | ICD-10-CM

## 2011-10-08 DIAGNOSIS — D369 Benign neoplasm, unspecified site: Secondary | ICD-10-CM | POA: Insufficient documentation

## 2011-10-08 DIAGNOSIS — Z0000 Encounter for general adult medical examination without abnormal findings: Secondary | ICD-10-CM

## 2011-10-08 DIAGNOSIS — Z23 Encounter for immunization: Secondary | ICD-10-CM

## 2011-10-08 MED ORDER — BENAZEPRIL HCL 20 MG PO TABS: 20.0000 mg | ORAL_TABLET | Freq: Every day | ORAL | Status: DC

## 2011-10-08 MED ORDER — ROSUVASTATIN CALCIUM 20 MG PO TABS: 20.0000 mg | ORAL_TABLET | Freq: Every day | ORAL | Status: DC

## 2011-10-08 NOTE — Patient Instructions (Addendum)
You had the flu shot today OK to stop the coreg You will be contacted regarding the referral for: ultrasound for the aorta Continue all other medications as before, and the refills were sent today Please keep your appointments with your specialists as you do Please return in 1 year for your yearly visit, or sooner if needed, with Lab testing done 3-5 days before

## 2011-10-08 NOTE — Progress Notes (Signed)
Subjective:    Patient ID: Fred [name], male    DOB: 1961/04/11, 50 y.o.   MRN: [id number]  HPI  Here for wellness and f/u;  Overall doing ok;  Pt denies CP, worsening SOB, DOE, wheezing, orthopnea, PND, worsening LE edema, palpitations, dizziness or syncope.  Pt denies neurological change such as new Headache, facial or extremity weakness.  Pt denies polydipsia, polyuria, or low sugar symptoms. Pt states overall good compliance with treatment and medications, good tolerability, and trying to follow lower cholesterol diet.  Pt denies worsening depressive symptoms, suicidal ideation or panic. No fever, wt loss, night sweats, loss of appetite, or other constitutional symptoms.  Pt states good ability with ADL's, low fall risk, home safety reviewed and adequate, no significant changes in hearing or vision, and occasionally active with exercise.  Did have left knee arthroscopy jan 2012 with good results.  Also had neg Prostate bx - sees urology q 6 mo.  Overall gained 6 lbs form 1 yr, but plans to re-start the gym soon.  Does also c/o significant orthostatic symptoms only at night or first thing in the AM before taking meds, passes quickly but is regular each night. Past Medical History  Diagnosis Date  . ALCOHOL ABUSE, EPISODIC, HX OF 02/15/2008  . ALLERGIC RHINITIS 02/15/2008  . DEPRESSION 07/28/2007  . HEARING LOSS 08/05/2008  . HYPERLIPIDEMIA 02/11/2008  . HYPERTENSION 07/28/2007  . PSA, INCREASED 10/01/2010  . TRANSAMINASES, SERUM, ELEVATED 09/01/2009   Past Surgical History  Procedure Date  . Left foot surgery 08/2007  . Inguinal herniorrhapy 1976    reports that he has never smoked. He does not have any smokeless tobacco history on file. He reports that he does not drink alcohol or use illicit drugs. family history includes Alcohol abuse in his mother; Cancer in his father; Colon polyps in his maternal grandmother; Depression in his mother; and Hypertension in his father. No Known  Allergies Current Outpatient Prescriptions on File Prior to Visit  Medication Sig Dispense Refill  . benazepril (LOTENSIN) 20 MG tablet Take 20 mg by mouth daily.        Marland Kitchen buPROPion (WELLBUTRIN XL) 300 MG 24 hr tablet Take 300 mg by mouth daily.        . carvedilol (COREG) 6.25 MG tablet Take 6.25 mg by mouth 2 (two) times daily.        . mirtazapine (REMERON) 45 MG tablet Take 45 mg by mouth daily.        . Omega-3 Fatty Acids (FISH OIL) 1000 MG CAPS Take by mouth daily.        . rosuvastatin (CRESTOR) 20 MG tablet Take 20 mg by mouth daily.         Review of Systems Review of Systems  Constitutional: Negative for diaphoresis, activity change, appetite change and unexpected weight change.  HENT: Negative for hearing loss, ear pain, facial swelling, mouth sores and neck stiffness.   Eyes: Negative for pain, redness and visual disturbance.  Respiratory: Negative for shortness of breath and wheezing.   Cardiovascular: Negative for chest pain and palpitations.  Gastrointestinal: Negative for diarrhea, blood in stool, abdominal distention and rectal pain.  Genitourinary: Negative for hematuria, flank pain and decreased urine volume.  Musculoskeletal: Negative for myalgias and joint swelling.  Skin: Negative for color change and wound.  Neurological: Negative for syncope and numbness.  Hematological: Negative for adenopathy.  Psychiatric/Behavioral: Negative for hallucinations, self-injury, decreased concentration and agitation.      Objective:  Physical Exam BP 100/70  Pulse 65  Temp(Src) 98.1 F (36.7 C) (Oral)  Ht 6\' 1"  (1.854 m)  Wt 201 lb (91.173 kg)  BMI 26.52 kg/m2  SpO2 98% Physical Exam  VS noted Constitutional: Pt is oriented to person, place, and time. Appears well-developed and well-nourished.  HENT:  Head: Normocephalic and atraumatic.  Right Ear: External ear normal.  Left Ear: External ear normal.  Nose: Nose normal.  Mouth/Throat: Oropharynx is clear and moist.   Eyes: Conjunctivae and EOM are normal. Pupils are equal, round, and reactive to light.  Neck: Normal range of motion. Neck supple. No JVD present. No tracheal deviation present.  Cardiovascular: Normal rate, regular rhythm, normal heart sounds and intact distal pulses.   Pulmonary/Chest: Effort normal and breath sounds normal.  Abdominal: Soft. Bowel sounds are normal. There is no tenderness.  Musculoskeletal: Normal range of motion. Exhibits no edema.  Lymphadenopathy:  Has no cervical adenopathy.  Neurological: Pt is alert and oriented to person, place, and time. Pt has normal reflexes. No cranial nerve deficit.  Skin: Skin is warm and dry. No rash noted.  Psychiatric:  Has  normal mood and affect. Behavior is normal.     Assessment & Plan:

## 2011-10-08 NOTE — Assessment & Plan Note (Signed)
Most likely overcontrolled BP with improved diet recently;  Ok to d/c the coreg,m f/u bp at home and next visit

## 2011-10-08 NOTE — Assessment & Plan Note (Signed)

## 2011-10-11 ENCOUNTER — Other Ambulatory Visit: Payer: Self-pay | Admitting: Cardiology

## 2011-10-11 DIAGNOSIS — Z136 Encounter for screening for cardiovascular disorders: Secondary | ICD-10-CM

## 2011-10-14 ENCOUNTER — Encounter (INDEPENDENT_AMBULATORY_CARE_PROVIDER_SITE_OTHER): Payer: BC Managed Care – PPO | Admitting: Cardiology

## 2011-10-14 DIAGNOSIS — I10 Essential (primary) hypertension: Secondary | ICD-10-CM

## 2011-10-14 DIAGNOSIS — Z136 Encounter for screening for cardiovascular disorders: Secondary | ICD-10-CM

## 2012-08-25 ENCOUNTER — Ambulatory Visit (INDEPENDENT_AMBULATORY_CARE_PROVIDER_SITE_OTHER): Payer: BC Managed Care – PPO | Admitting: Internal Medicine

## 2012-08-25 ENCOUNTER — Encounter: Payer: Self-pay | Admitting: Internal Medicine

## 2012-08-25 VITALS — BP 122/80 | HR 85 | Temp 98.1°F | Ht 73.0 in | Wt 209.4 lb

## 2012-08-25 DIAGNOSIS — H9190 Unspecified hearing loss, unspecified ear: Secondary | ICD-10-CM

## 2012-08-25 DIAGNOSIS — I10 Essential (primary) hypertension: Secondary | ICD-10-CM

## 2012-08-25 DIAGNOSIS — J019 Acute sinusitis, unspecified: Secondary | ICD-10-CM | POA: Insufficient documentation

## 2012-08-25 DIAGNOSIS — J309 Allergic rhinitis, unspecified: Secondary | ICD-10-CM

## 2012-08-25 DIAGNOSIS — Z23 Encounter for immunization: Secondary | ICD-10-CM

## 2012-08-25 MED ORDER — AZITHROMYCIN 250 MG PO TABS: ORAL_TABLET | ORAL | Status: DC

## 2012-08-25 NOTE — Progress Notes (Signed)
Subjective:    Patient ID: Fred [name], male    DOB: 1961/02/06, 51 y.o.   MRN: [id number]  HPI   Here with 3 days acute onset fever, facial pain, pressure, general weakness and malaise, and greenish d/c, with slight ST, but little to no cough and Pt denies chest pain, increased sob or doe, wheezing, orthopnea, PND, increased LE swelling, palpitations, dizziness or syncope.   Pt denies polydipsia, polyuria.  Pt denies new neurological symptoms such as new headache, or facial or extremity weakness or numbness  Does have several wks ongoing nasal allergy symptoms with clear congestion, itch and sneeze, without fever, pain, ST, cough or wheezing.  About 6 mo ago had singifcant dizziness with lotensin 20 mg, BP low < 110, stopped med on his own.  Also with 1 wk hearing loss right ear without pain, d/c, vertigo. Past Medical History  Diagnosis Date  . ALCOHOL ABUSE, EPISODIC, HX OF 02/15/2008  . ALLERGIC RHINITIS 02/15/2008  . DEPRESSION 07/28/2007  . HEARING LOSS 08/05/2008  . HYPERLIPIDEMIA 02/11/2008  . HYPERTENSION 07/28/2007  . PSA, INCREASED 10/01/2010  . TRANSAMINASES, SERUM, ELEVATED 09/01/2009  . Adenomatous polyp 10/08/2011   Past Surgical History  Procedure Date  . Left foot surgery 08/2007  . Inguinal herniorrhapy 1976    reports that he has never smoked. He does not have any smokeless tobacco history on file. He reports that he does not drink alcohol or use illicit drugs. family history includes Alcohol abuse in his mother; Cancer in his father; Colon polyps in his maternal grandmother; Depression in his mother; and Hypertension in his father. No Known Allergies Current Outpatient Prescriptions on File Prior to Visit  Medication Sig Dispense Refill  . buPROPion (WELLBUTRIN XL) 300 MG 24 hr tablet Take 300 mg by mouth daily.        . mirtazapine (REMERON) 45 MG tablet Take 45 mg by mouth daily.        . Omega-3 Fatty Acids (FISH OIL) 1000 MG CAPS Take by mouth daily.        .  rosuvastatin (CRESTOR) 20 MG tablet Take 1 tablet (20 mg total) by mouth daily.  90 tablet  3   Review of Systems  Constitutional: Negative for diaphoresis and unexpected weight change.  HENT: Negative for tinnitus.   Eyes: Negative for photophobia and visual disturbance.  Respiratory: Negative for choking and stridor.   Gastrointestinal: Negative for vomiting and blood in stool.  Genitourinary: Negative for hematuria and decreased urine volume.  Musculoskeletal: Negative for gait problem.  Skin: Negative for color change and wound.  Neurological: Negative for tremors and numbness.  Psychiatric/Behavioral: Negative for decreased concentration. The patient is not hyperactive.       Objective:   Physical Exam BP 122/80  Pulse 85  Temp 98.1 F (36.7 C) (Oral)  Ht 6\' 1"  (1.854 m)  Wt 209 lb 6 oz (94.972 kg)  BMI 27.62 kg/m2  SpO2 97% Physical Exam  VS noted, mild ill Constitutional: Pt appears well-developed and well-nourished.  HENT: Head: Normocephalic.  Right Ear: External ear normal.  Left Ear: External ear normal.  Right ear impaction cleared with irrigation Bilat tm's mild erythema.  Sinus tender.  Pharynx mild erythema Eyes: Conjunctivae and EOM are normal. Pupils are equal, round, and reactive to light.  Neck: Normal range of motion. Neck supple.  Cardiovascular: Normal rate and regular rhythm.   Pulmonary/Chest: Effort normal and breath sounds normal.  Neurological: Pt is alert. Not confused  Skin:  Skin is warm. No erythema.  Psychiatric: Pt behavior is normal. Thought content normal.     Assessment & Plan:

## 2012-08-25 NOTE — Assessment & Plan Note (Signed)
Mild to mod, for allegra otc prnm  to f/u any worsening symptoms or concerns

## 2012-08-25 NOTE — Assessment & Plan Note (Signed)
stable overall by hx and exam, most recent data reviewed with pt, and pt to continue medical treatment as before - off med for now  BP Readings from Last 3 Encounters:  08/25/12 122/80  10/08/11 100/70  10/01/10 142/92

## 2012-08-25 NOTE — Assessment & Plan Note (Signed)
Mild to mod, for antibx course,  to f/u any worsening symptoms or concerns 

## 2012-08-25 NOTE — Patient Instructions (Addendum)
Your right ear was irrigated of wax today Take all new medications as prescribed You can also take Delsym OTC for cough, and/or Mucinex (or it's generic off brand) for congestion Continue all other medications as before You had the flu shot today Please return in 2 mo with Lab testing done 3-5 days before

## 2012-08-25 NOTE — Assessment & Plan Note (Signed)
With large wax impaction on right - for irrigation today  ADD:  Improved hearing after

## 2012-09-14 ENCOUNTER — Encounter: Payer: Self-pay | Admitting: Internal Medicine

## 2012-09-14 ENCOUNTER — Ambulatory Visit (INDEPENDENT_AMBULATORY_CARE_PROVIDER_SITE_OTHER): Payer: BC Managed Care – PPO | Admitting: Internal Medicine

## 2012-09-14 VITALS — BP 138/90 | HR 89 | Temp 97.8°F | Resp 16 | Ht 73.0 in | Wt 210.0 lb

## 2012-09-14 DIAGNOSIS — J019 Acute sinusitis, unspecified: Secondary | ICD-10-CM

## 2012-09-14 MED ORDER — AMOXICILLIN-POT CLAVULANATE 875-125 MG PO TABS: 1.0000 | ORAL_TABLET | Freq: Two times a day (BID) | ORAL | Status: AC

## 2012-09-14 NOTE — Progress Notes (Signed)
Subjective:    Patient ID: Fred [name], male    DOB: [birth year], 51 y.o.   MRN: [id number]  Sinusitis This is a recurrent problem. The current episode started 1 to 4 weeks ago. The problem has been gradually worsening since onset. The maximum temperature recorded prior to his arrival was 100 - 100.9 F. His pain is at a severity of 0/10. He is experiencing no pain. Associated symptoms include congestion, sinus pressure and a sore throat. Pertinent negatives include no chills, coughing, diaphoresis, ear pain, headaches, hoarse voice, neck pain, shortness of breath, sneezing or swollen glands. Past treatments include antibiotics (zpak). The treatment provided no relief.      Review of Systems  Constitutional: Negative for chills and diaphoresis.  HENT: Positive for congestion, sore throat, rhinorrhea, postnasal drip and sinus pressure. Negative for ear pain, nosebleeds, hoarse voice, sneezing, trouble swallowing, neck pain, voice change and tinnitus.   Eyes: Negative.   Respiratory: Negative for cough and shortness of breath.   Cardiovascular: Negative for chest pain, palpitations and leg swelling.  Gastrointestinal: Negative.   Genitourinary: Negative.   Musculoskeletal: Negative.   Skin: Negative.   Neurological: Negative.  Negative for headaches.  Hematological: Negative for adenopathy. Does not bruise/bleed easily.  Psychiatric/Behavioral: Negative.        Objective:   Physical Exam  Vitals reviewed. Constitutional: He is oriented to person, place, and time. He appears well-developed and well-nourished.  Non-toxic appearance. He does not have a sickly appearance. He does not appear ill. No distress.  HENT:  Head: Normocephalic and atraumatic. No trismus in the jaw.  Right Ear: Hearing, tympanic membrane, external ear and ear canal normal.  Left Ear: Hearing, tympanic membrane, external ear and ear canal normal.  Nose: No mucosal edema, rhinorrhea or sinus tenderness. Right  sinus exhibits maxillary sinus tenderness. Right sinus exhibits no frontal sinus tenderness. Left sinus exhibits maxillary sinus tenderness. Left sinus exhibits no frontal sinus tenderness.  Mouth/Throat: Oropharynx is clear and moist and mucous membranes are normal. Mucous membranes are not pale, not dry and not cyanotic. No oral lesions. No uvula swelling. No oropharyngeal exudate, posterior oropharyngeal edema, posterior oropharyngeal erythema or tonsillar abscesses.  Eyes: Conjunctivae normal are normal. Right eye exhibits no discharge. Left eye exhibits no discharge. No scleral icterus.  Neck: Normal range of motion. Neck supple. No JVD present. No tracheal deviation present. No thyromegaly present.  Cardiovascular: Normal rate, regular rhythm, normal heart sounds and intact distal pulses.  Exam reveals no gallop and no friction rub.   No murmur heard. Pulmonary/Chest: Effort normal and breath sounds normal. No stridor. No respiratory distress. He has no wheezes. He has no rales. He exhibits no tenderness.  Abdominal: Soft. Bowel sounds are normal. He exhibits no distension. There is no tenderness. There is no rebound and no guarding.  Musculoskeletal: Normal range of motion. He exhibits no edema and no tenderness.  Lymphadenopathy:    He has no cervical adenopathy.  Neurological: He is oriented to person, place, and time.  Skin: Skin is warm and dry. No rash noted. He is not diaphoretic. No erythema. No pallor.  Psychiatric: He has a normal mood and affect. His behavior is normal. Judgment and thought content normal.     Lab Results  Component Value Date   WBC 7.4 10/04/2011   HGB 14.4 10/04/2011   HCT 41.5 10/04/2011   PLT 233.0 10/04/2011   GLUCOSE 101* 10/04/2011   CHOL 134 10/04/2011   TRIG 124.0 10/04/2011  HDL 39.80 10/04/2011   LDLDIRECT 103.3 07/20/2009   LDLCALC 69 10/04/2011   ALT 25 10/04/2011   AST 18 10/04/2011   NA 143 10/04/2011   K 4.6 10/04/2011   CL 107 10/04/2011    CREATININE 1.0 10/04/2011   BUN 14 10/04/2011   CO2 26 10/04/2011   TSH 2.16 10/04/2011   PSA 3.47 10/04/2011   HGBA1C 4.7 07/02/2007       Assessment & Plan:

## 2012-09-14 NOTE — Patient Instructions (Signed)

## 2012-09-14 NOTE — Assessment & Plan Note (Signed)
Start augmentin 

## 2012-10-24 ENCOUNTER — Emergency Department (HOSPITAL_COMMUNITY): Payer: BC Managed Care – PPO

## 2012-10-24 ENCOUNTER — Emergency Department (HOSPITAL_COMMUNITY)
Admission: EM | Admit: 2012-10-24 | Discharge: 2012-10-24 | Disposition: A | Discharge status: Home / Self Care | Payer: BC Managed Care – PPO | Attending: Emergency Medicine | Admitting: Emergency Medicine

## 2012-10-24 ENCOUNTER — Encounter (HOSPITAL_COMMUNITY): Payer: Self-pay | Admitting: *Deleted

## 2012-10-24 DIAGNOSIS — Z7982 Long term (current) use of aspirin: Secondary | ICD-10-CM | POA: Insufficient documentation

## 2012-10-24 DIAGNOSIS — H9190 Unspecified hearing loss, unspecified ear: Secondary | ICD-10-CM | POA: Insufficient documentation

## 2012-10-24 DIAGNOSIS — F329 Major depressive disorder, single episode, unspecified: Secondary | ICD-10-CM | POA: Insufficient documentation

## 2012-10-24 DIAGNOSIS — R1013 Epigastric pain: Secondary | ICD-10-CM | POA: Insufficient documentation

## 2012-10-24 DIAGNOSIS — F3289 Other specified depressive episodes: Secondary | ICD-10-CM | POA: Insufficient documentation

## 2012-10-24 DIAGNOSIS — I10 Essential (primary) hypertension: Secondary | ICD-10-CM | POA: Insufficient documentation

## 2012-10-24 DIAGNOSIS — Z79899 Other long term (current) drug therapy: Secondary | ICD-10-CM | POA: Insufficient documentation

## 2012-10-24 DIAGNOSIS — E785 Hyperlipidemia, unspecified: Secondary | ICD-10-CM | POA: Insufficient documentation

## 2012-10-24 DIAGNOSIS — F1010 Alcohol abuse, uncomplicated: Secondary | ICD-10-CM | POA: Insufficient documentation

## 2012-10-24 LAB — CBC WITH DIFFERENTIAL/PLATELET
Basophils Absolute: 0 10*3/uL (ref 0.0–0.1)
Eosinophils Relative: 1 % (ref 0–5)
HCT: 40.3 % (ref 39.0–52.0)
Hemoglobin: 14.1 g/dL (ref 13.0–17.0)
Lymphocytes Relative: 21 % (ref 12–46)
MCV: 88.2 fL (ref 78.0–100.0)
Monocytes Absolute: 0.6 10*3/uL (ref 0.1–1.0)
Monocytes Relative: 5 % (ref 3–12)
Neutro Abs: 7.6 10*3/uL (ref 1.7–7.7)
RDW: 12.5 % (ref 11.5–15.5)
WBC: 10.4 10*3/uL (ref 4.0–10.5)

## 2012-10-24 LAB — COMPREHENSIVE METABOLIC PANEL
AST: 22 U/L (ref 0–37)
BUN: 19 mg/dL (ref 6–23)
CO2: 24 mEq/L (ref 19–32)
Calcium: 9 mg/dL (ref 8.4–10.5)
Chloride: 107 mEq/L (ref 96–112)
Creatinine, Ser: 1.07 mg/dL (ref 0.50–1.35)
GFR calc Af Amer: >90 mL/min (ref >90)
GFR calc non Af Amer: 78 mL/min — ABNORMAL LOW (ref >90)
Glucose, Bld: 115 mg/dL — ABNORMAL HIGH (ref 70–99)
Total Bilirubin: 0.2 mg/dL — ABNORMAL LOW (ref 0.3–1.2)

## 2012-10-24 LAB — POCT I-STAT TROPONIN I: Troponin i, poc: 0.01 ng/mL (ref 0.00–0.08)

## 2012-10-24 LAB — LIPASE, BLOOD: Lipase: 31 U/L (ref 11–59)

## 2012-10-24 MED ORDER — HYDROCODONE-ACETAMINOPHEN 5-325 MG PO TABS: ORAL_TABLET | ORAL | Status: DC

## 2012-10-24 MED ORDER — OMEPRAZOLE 20 MG PO CPDR: DELAYED_RELEASE_CAPSULE | ORAL | Status: DC

## 2012-10-24 MED ORDER — ONDANSETRON 8 MG PO TBDP: 8.0000 mg | ORAL_TABLET | Freq: Three times a day (TID) | ORAL | Status: DC | PRN

## 2012-10-24 MED ORDER — ONDANSETRON HCL 4 MG/2ML IJ SOLN
4.0000 mg | Freq: Once | INTRAMUSCULAR | Status: AC
  Administered 2012-10-24: 4 mg via INTRAVENOUS
  Filled 2012-10-24: qty 2

## 2012-10-24 MED ORDER — MORPHINE SULFATE 4 MG/ML IJ SOLN
4.0000 mg | Freq: Once | INTRAMUSCULAR | Status: AC
  Administered 2012-10-24: 4 mg via INTRAVENOUS
  Filled 2012-10-24: qty 1

## 2012-10-24 NOTE — ED Notes (Signed)
Patient transported to Ultrasound 

## 2012-10-24 NOTE — ED Provider Notes (Signed)
History     CSN: [id number]  Arrival date & time 10/24/12  7829   First MD Initiated Contact with Patient 10/24/12 [phone]      Chief Complaint  Patient presents with  . Abdominal Pain    (Consider location/radiation/quality/duration/timing/severity/associated sxs/prior treatment) HPI  Patient presents EMS with complaints of abdominal pain. He states at 1:30 in the morning he woke up with a "grinding" epigastric pain that shoots straight through into his back. He states at first he thought it was indigestion and he took Pepto-Bismol without results. He repeated at 2:30 and again was not improved. He states the pain radiates off and on and will shoot straight back into his back. He states he got to the point where he couldn't sits still and he was pacing in his house and thought he should come to the emergency department. He has had nausea without vomiting, he denies burning reflux symptoms, he denies shortness of breath. He states last night for dinner he had spicy sausage and eggs. He states he had one prior episode about a month ago that also occurred at night and was relieved with the Pepto-Bismol. He states his current pain as a 6/10, at its worst and it was an 8/10.  Patient reports both parents have had cholecystectomies.  PCP Dr. Jonny Ruiz  Past Medical History  Diagnosis Date  . ALCOHOL ABUSE, EPISODIC, HX OF 02/15/2008  . ALLERGIC RHINITIS 02/15/2008  . DEPRESSION 07/28/2007  . HEARING LOSS 08/05/2008  . HYPERLIPIDEMIA 02/11/2008  . HYPERTENSION 07/28/2007  . PSA, INCREASED 10/01/2010  . TRANSAMINASES, SERUM, ELEVATED 09/01/2009  . Adenomatous polyp 10/08/2011    Past Surgical History  Procedure Date  . Left foot surgery 08/2007  . Inguinal herniorrhapy 1976    Family History  Problem Relation Age of Onset  . Depression Mother   . Alcohol abuse Mother   . Hypertension Father   . Cancer Father     prostate cancer and PVD ileac aneurysm followed by the [facility]  . Colon  polyps Maternal Grandmother    father of patient has a aortic aneurysm that is being followed Paternal grandmother died at age 74 from an MI  History  Substance Use Topics  . Smoking status: Never Smoker   . Smokeless tobacco: Not on file  . Alcohol Use: No   employed   Review of Systems  All other systems reviewed and are negative.    Allergies  Review of patient's allergies indicates no known allergies.  Home Medications   Current Outpatient Rx  Name  Route  Sig  Dispense  Refill  . ASPIRIN 325 MG PO TABS   Oral   Take 325 mg by mouth daily.         . BUPROPION HCL ER (XL) 300 MG PO TB24   Oral   Take 300 mg by mouth daily.          Marland Kitchen LORATADINE 10 MG PO TABS   Oral   Take 10 mg by mouth daily.         Marland Kitchen MIRTAZAPINE 45 MG PO TABS   Oral   Take 45 mg by mouth at bedtime.          Marland Kitchen FISH OIL 1000 MG PO CAPS   Oral   Take 1 capsule by mouth daily.            BP 166/86  Pulse 85  Temp 97.6 F (36.4 C) (Oral)  Resp 18  SpO2 99%  Vital signs normal    Physical Exam  Nursing note and vitals reviewed. Constitutional: He is oriented to person, place, and time. He appears well-developed and well-nourished.  Non-toxic appearance. He does not appear ill. No distress.  HENT:  Head: Normocephalic and atraumatic.  Right Ear: External ear normal.  Left Ear: External ear normal.  Nose: Nose normal. No mucosal edema or rhinorrhea.  Mouth/Throat: Oropharynx is clear and moist and mucous membranes are normal. No dental abscesses or uvula swelling.  Eyes: Conjunctivae normal and EOM are normal. Pupils are equal, round, and reactive to light.  Neck: Normal range of motion and full passive range of motion without pain. Neck supple.  Cardiovascular: Normal rate, regular rhythm and normal heart sounds.  Exam reveals no gallop and no friction rub.   No murmur heard. Pulmonary/Chest: Effort normal and breath sounds normal. No respiratory distress. He has no  wheezes. He has no rhonchi. He has no rales. He exhibits no tenderness and no crepitus.  Abdominal: Soft. Normal appearance and bowel sounds are normal. He exhibits no distension. There is tenderness. There is no rebound and no guarding.    Musculoskeletal: Normal range of motion. He exhibits no edema and no tenderness.       Moves all extremities well.   Neurological: He is alert and oriented to person, place, and time. He has normal strength. No cranial nerve deficit.  Skin: Skin is warm, dry and intact. No rash noted. No erythema. No pallor.  Psychiatric: He has a normal mood and affect. His speech is normal and behavior is normal. His mood appears not anxious.    ED Course  Procedures (including critical care time)   Medications  morphine 4 MG/ML injection 4 mg (4 mg Intravenous Given 10/24/12 0508)  ondansetron (ZOFRAN) injection 4 mg (4 mg Intravenous Given 10/24/12 0507)   Recheck 06:15 pain is gone, have discussed getting [place] to check for gallstones. If negative, will put on PPI and refer to GI.   Discussed test results and need for further testing possibly EDG or HIDA scan. Will start on PPI and avoid spicey foods for now.   Results for orders placed during the hospital encounter of 10/24/12  CBC WITH DIFFERENTIAL      Component Value Range   WBC 10.4  4.0 - 10.5 K/uL   RBC 4.57  4.22 - 5.81 MIL/uL   Hemoglobin 14.1  13.0 - 17.0 g/dL   HCT 95.6  21.3 - 08.6 %   MCV 88.2  78.0 - 100.0 fL   MCH 30.9  26.0 - 34.0 pg   MCHC 35.0  30.0 - 36.0 g/dL   RDW 57.8  46.9 - 62.9 %   Platelets 233  150 - 400 K/uL   Neutrophils Relative 73  43 - 77 %   Neutro Abs 7.6  1.7 - 7.7 K/uL   Lymphocytes Relative 21  12 - 46 %   Lymphs Abs 2.2  0.7 - 4.0 K/uL   Monocytes Relative 5  3 - 12 %   Monocytes Absolute 0.6  0.1 - 1.0 K/uL   Eosinophils Relative 1  0 - 5 %   Eosinophils Absolute 0.1  0.0 - 0.7 K/uL   Basophils Relative 0  0 - 1 %   Basophils Absolute 0.0  0.0 - 0.1 K/uL    COMPREHENSIVE METABOLIC PANEL      Component Value Range   Sodium 142  135 - 145 mEq/L   Potassium 3.7  3.5 -  5.1 mEq/L   Chloride 107  96 - 112 mEq/L   CO2 24  19 - 32 mEq/L   Glucose, Bld 115 (*) 70 - 99 mg/dL   BUN 19  6 - 23 mg/dL   Creatinine, Ser 0.98  0.50 - 1.35 mg/dL   Calcium 9.0  8.4 - 11.9 mg/dL   Total Protein 6.4  6.0 - 8.3 g/dL   Albumin 3.5  3.5 - 5.2 g/dL   AST 22  0 - 37 U/L   ALT 22  0 - 53 U/L   Alkaline Phosphatase 79  39 - 117 U/L   Total Bilirubin 0.2 (*) 0.3 - 1.2 mg/dL   GFR calc non Af Amer 78 (*) >90 mL/min   GFR calc Af Amer >90  >90 mL/min  LIPASE, BLOOD      Component Value Range   Lipase 31  11 - 59 U/L  POCT I-STAT TROPONIN I      Component Value Range   Troponin i, poc 0.01  0.00 - 0.08 ng/mL   Comment 3            Laboratory interpretation all normal   US Abdomen Complete  10/24/2012  *RADIOLOGY REPORT*  Clinical Data:  Right upper quadrant abdominal pain.  COMPLETE ABDOMINAL ULTRASOUND  Comparison:  None.  Findings:  Gallbladder:  No shadowing gallstones or echogenic sludge.  No gallbladder wall thickening or pericholecystic fluid.  No sonographic Murphy's sign according to the ultrasound technologist. Wall thickness is 2.7 mm, within normal limits.  Common bile duct:  Normal in caliber. No biliary ductal dilation. The maximal diameter is 4.0 mm.  Liver:  No focal lesion identified.  Within normal limits in parenchymal echogenicity.  IVC:  Appears normal.  Pancreas:  No focal abnormality seen.  Spleen:  Normal size and echotexture without focal parenchymal abnormality.  The maximal diameter is 5.9 cm, within normal limits.  Right Kidney:  No hydronephrosis.  Well-preserved cortex.  Normal size and parenchymal echotexture without focal abnormalities. The maximal length is 9.3 cm, within normal limits.  Left Kidney:  No hydronephrosis.  Well-preserved cortex.  Normal size and parenchymal echotexture without focal abnormalities. The maximal length is  10.9, within normal limits.  Abdominal aorta:  No aneurysm identified.  IMPRESSION: Negative abdominal ultrasound.   Original Report Authenticated By: Marin Roberts, M.D.      Date: 10/24/2012  Rate: 69  Rhythm: normal sinus rhythm  QRS Axis: left  Intervals: normal  ST/T Wave abnormalities: normal  Conduction Disutrbances:none  Narrative Interpretation:   Old EKG Reviewed: unchanged from 10/08/2011    1. Epigastric abdominal pain     New Prescriptions   HYDROCODONE-ACETAMINOPHEN (NORCO/VICODIN) 5-325 MG PER TABLET    Take 1 or 2 po Q 6hrs for pain   OMEPRAZOLE (PRILOSEC) 20 MG CAPSULE    Take 1 po BID x 2 weeks then once a day   ONDANSETRON (ZOFRAN ODT) 8 MG DISINTEGRATING TABLET    Take 1 tablet (8 mg total) by mouth every 8 (eight) hours as needed for nausea.    Plan discharge  Take 1 po BID x 2 weeks then once a day   MDM          Ward Givens, MD 10/24/12 [phone]

## 2012-10-24 NOTE — ED Notes (Signed)
MD at bedside. 

## 2012-10-24 NOTE — ED Notes (Signed)
Per EMS: pt coming from home with mid abdominal pain, radiates to lower back. Pt reports an intermittent "grinding" pain. Pt denies chest pain, shortness of breath, n/v. Denies any ETOH. Last drink was two years ago. Pt reports relief with movement, not tender on palpation. Pt states parents have hx of gallbladder surgery. A&Ox4, skin warm and dry, respirations equal and unlabored.

## 2012-10-27 ENCOUNTER — Telehealth: Payer: Self-pay | Admitting: Gastroenterology

## 2012-10-27 NOTE — Telephone Encounter (Signed)
Why?  

## 2012-10-27 NOTE — Telephone Encounter (Signed)
ok 

## 2012-10-27 NOTE — Telephone Encounter (Signed)
Pt seen in 10/2010 followed by [facility] revealing tubular adenomas; recall in 5 years. Pt seen in ER 10/24/12 for extreme sternal pain with U/S negative for gall bladder problems. He was started on Prilosec and asked to f/u with [place]. Dr Jarold Motto, OK for pt to change to Dr Russella Dar or Dr Juanda Chance? Thanks.

## 2012-10-27 NOTE — Telephone Encounter (Signed)
OK 

## 2012-10-27 NOTE — Telephone Encounter (Signed)
Dr Russella Dar, will you accept this pt? See previous notes on this encounter. Thanks.

## 2012-10-27 NOTE — Telephone Encounter (Signed)
Pt stated he wasn't satisfied with you; that's all he said.

## 2012-10-28 NOTE — Telephone Encounter (Signed)
Lm for pt to call to schedule appt with Dr. Russella Dar

## 2012-10-28 NOTE — Telephone Encounter (Signed)
sched appt with Dr. Russella Dar for pt on 12/04/12

## 2012-11-03 ENCOUNTER — Other Ambulatory Visit (INDEPENDENT_AMBULATORY_CARE_PROVIDER_SITE_OTHER): Payer: BC Managed Care – PPO

## 2012-11-03 DIAGNOSIS — Z0000 Encounter for general adult medical examination without abnormal findings: Secondary | ICD-10-CM

## 2012-11-03 LAB — LIPID PANEL
Cholesterol: 236 mg/dL — ABNORMAL HIGH (ref 0–200)
HDL: 34.6 mg/dL — ABNORMAL LOW (ref >39.00)
Triglycerides: 235 mg/dL — ABNORMAL HIGH (ref 0.0–149.0)

## 2012-11-03 LAB — URINALYSIS, ROUTINE W REFLEX MICROSCOPIC
Bilirubin Urine: NEGATIVE
Hgb urine dipstick: NEGATIVE
Ketones, ur: NEGATIVE
Total Protein, Urine: NEGATIVE
Urine Glucose: NEGATIVE
Urobilinogen, UA: 0.2 (ref 0.0–1.0)

## 2012-11-03 LAB — CBC WITH DIFFERENTIAL/PLATELET
Basophils Relative: 0.3 % (ref 0.0–3.0)
Eosinophils Absolute: 0.2 10*3/uL (ref 0.0–0.7)
HCT: 43.9 % (ref 39.0–52.0)
Hemoglobin: 14.8 g/dL (ref 13.0–17.0)
Lymphocytes Relative: 31.4 % (ref 12.0–46.0)
Lymphs Abs: 2.6 10*3/uL (ref 0.7–4.0)
MCHC: 33.8 g/dL (ref 30.0–36.0)
Monocytes Relative: 6.1 % (ref 3.0–12.0)
Neutro Abs: 4.9 10*3/uL (ref 1.4–7.7)
RBC: 4.86 Mil/uL (ref 4.22–5.81)
RDW: 13.1 % (ref 11.5–14.6)

## 2012-11-03 LAB — BASIC METABOLIC PANEL
Calcium: 9.3 mg/dL (ref 8.4–10.5)
Creatinine, Ser: 1.3 mg/dL (ref 0.4–1.5)
Sodium: 139 mEq/L (ref 135–145)

## 2012-11-03 LAB — HEPATIC FUNCTION PANEL
AST: 19 U/L (ref 0–37)
Albumin: 3.8 g/dL (ref 3.5–5.2)
Alkaline Phosphatase: 79 U/L (ref 39–117)
Total Protein: 6.2 g/dL (ref 6.0–8.3)

## 2012-11-04 ENCOUNTER — Ambulatory Visit: Payer: BC Managed Care – PPO

## 2012-11-04 DIAGNOSIS — R7309 Other abnormal glucose: Secondary | ICD-10-CM

## 2012-11-10 ENCOUNTER — Encounter: Payer: Self-pay | Admitting: Internal Medicine

## 2012-11-10 ENCOUNTER — Encounter: Payer: BC Managed Care – PPO | Admitting: Internal Medicine

## 2012-11-10 ENCOUNTER — Ambulatory Visit (INDEPENDENT_AMBULATORY_CARE_PROVIDER_SITE_OTHER): Payer: BC Managed Care – PPO | Admitting: Internal Medicine

## 2012-11-10 VITALS — BP 112/80 | HR 72 | Temp 97.7°F | Ht 73.0 in | Wt 204.5 lb

## 2012-11-10 DIAGNOSIS — E785 Hyperlipidemia, unspecified: Secondary | ICD-10-CM

## 2012-11-10 DIAGNOSIS — Z0000 Encounter for general adult medical examination without abnormal findings: Secondary | ICD-10-CM

## 2012-11-10 DIAGNOSIS — R7309 Other abnormal glucose: Secondary | ICD-10-CM

## 2012-11-10 DIAGNOSIS — R7302 Impaired glucose tolerance (oral): Secondary | ICD-10-CM | POA: Insufficient documentation

## 2012-11-10 MED ORDER — HYDROCORTISONE 2.5 % RE CREA: TOPICAL_CREAM | Freq: Two times a day (BID) | RECTAL | Status: DC

## 2012-11-10 MED ORDER — ROSUVASTATIN CALCIUM 20 MG PO TABS: 20.0000 mg | ORAL_TABLET | Freq: Every day | ORAL | Status: DC

## 2012-11-10 NOTE — Patient Instructions (Addendum)
Take all new medications as prescribed - the cream Continue all other medications as before, including re-starting the crestor Please have the pharmacy call with any other refills you may need. Please continue your efforts at being more active, low cholesterol diet, and weight control. Please keep your appointments with your specialists as you have planned Your labs will be faxed to urology Please return in 1 year for your yearly visit, or sooner if needed, with Lab testing done 3-5 days before

## 2012-11-10 NOTE — Assessment & Plan Note (Signed)
Asked pt to re-start statin for uncontrolled LDL, goal < 100 minimum

## 2012-11-10 NOTE — Progress Notes (Signed)
Subjective:    Patient ID: Fred [name], male    DOB: 1961-10-21, 51 y.o.   MRN: [id number]  HPI  Here for wellness and f/u;  Overall doing ok;  Pt denies CP, worsening SOB, DOE, wheezing, orthopnea, PND, worsening LE edema, palpitations, dizziness or syncope.  Pt denies neurological change such as new Headache, facial or extremity weakness.  Pt denies polydipsia, polyuria, or low sugar symptoms. Pt states overall good compliance with treatment and medications, good tolerability, and trying to follow lower cholesterol diet.  Pt denies worsening depressive symptoms, suicidal ideation or panic. No fever, wt loss, night sweats, loss of appetite, or other constitutional symptoms.  Pt states good ability with ADL's, low fall risk, home safety reviewed and adequate, no significant changes in hearing or vision, and occasionally active with exercise.  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  Has been not taking the crestor for 6 mo, trying to work on diet and lifestyle.  Has recurrent anal itching better with steroid cream in past.  Did have rather severe episode CP recent though to be GI related, has appt to f/u with Dr Sharmon Leyden Past Medical History  Diagnosis Date  . ALCOHOL ABUSE, EPISODIC, HX OF 02/15/2008  . ALLERGIC RHINITIS 02/15/2008  . DEPRESSION 07/28/2007  . HEARING LOSS 08/05/2008  . HYPERLIPIDEMIA 02/11/2008  . HYPERTENSION 07/28/2007  . PSA, INCREASED 10/01/2010  . TRANSAMINASES, SERUM, ELEVATED 09/01/2009  . Adenomatous polyp 10/08/2011   Past Surgical History  Procedure Date  . Left foot surgery 08/2007  . Inguinal herniorrhapy 1976    reports that he has never smoked. He does not have any smokeless tobacco history on file. He reports that he does not drink alcohol or use illicit drugs. family history includes Alcohol abuse in his mother; Cancer in his father; Colon polyps in his maternal grandmother; Depression in his mother; and Hypertension in his  father. No Known Allergies Current Outpatient Prescriptions on File Prior to Visit  Medication Sig Dispense Refill  . aspirin 325 MG tablet Take 325 mg by mouth daily.      Marland Kitchen buPROPion (WELLBUTRIN XL) 300 MG 24 hr tablet Take 300 mg by mouth daily.       Marland Kitchen HYDROcodone-acetaminophen (NORCO/VICODIN) 5-325 MG per tablet Take 1 or 2 po Q 6hrs for pain  10 tablet  0  . loratadine (CLARITIN) 10 MG tablet Take 10 mg by mouth daily.      . mirtazapine (REMERON) 45 MG tablet Take 45 mg by mouth at bedtime.       . Omega-3 Fatty Acids (FISH OIL) 1000 MG CAPS Take 1 capsule by mouth daily.       Marland Kitchen omeprazole (PRILOSEC) 20 MG capsule Take 1 po BID x 2 weeks then once a day  60 capsule  0  . ondansetron (ZOFRAN ODT) 8 MG disintegrating tablet Take 1 tablet (8 mg total) by mouth every 8 (eight) hours as needed for nausea.  4 tablet  0   Review of Systems Review of Systems  Constitutional: Negative for diaphoresis, activity change, appetite change and unexpected weight change.  HENT: Negative for hearing loss, ear pain, facial swelling, mouth sores and neck stiffness.   Eyes: Negative for pain, redness and visual disturbance.  Respiratory: Negative for shortness of breath and wheezing.   Cardiovascular: Negative for chest pain and palpitations.  Gastrointestinal: Negative for diarrhea, blood in stool, abdominal distention and rectal pain.  Genitourinary: Negative for hematuria, flank  pain and decreased urine volume.  Musculoskeletal: Negative for myalgias and joint swelling.  Skin: Negative for color change and wound.  Neurological: Negative for syncope and numbness.  Hematological: Negative for adenopathy.  Psychiatric/Behavioral: Negative for hallucinations, self-injury, decreased concentration and agitation.      Objective:   Physical Exam BP 112/80  Pulse 72  Temp 97.7 F (36.5 C) (Oral)  Ht 6\' 1"  (1.854 m)  Wt 204 lb 8 oz (92.761 kg)  BMI 26.98 kg/m2  SpO2 97% Physical Exam  VS  noted Constitutional: Pt is oriented to person, place, and time. Appears well-developed and well-nourished.  HENT:  Head: Normocephalic and atraumatic.  Right Ear: External ear normal.  Left Ear: External ear normal.  Nose: Nose normal.  Mouth/Throat: Oropharynx is clear and moist.  Eyes: Conjunctivae and EOM are normal. Pupils are equal, round, and reactive to light.  Neck: Normal range of motion. Neck supple. No JVD present. No tracheal deviation present.  Cardiovascular: Normal rate, regular rhythm, normal heart sounds and intact distal pulses.   Pulmonary/Chest: Effort normal and breath sounds normal.  Abdominal: Soft. Bowel sounds are normal. There is no tenderness.  Musculoskeletal: Normal range of motion. Exhibits no edema.  Lymphadenopathy:  Has no cervical adenopathy.  Neurological: Pt is alert and oriented to person, place, and time. Pt has normal reflexes. No cranial nerve deficit.  Skin: Skin is warm and dry. No rash noted.  Psychiatric:  Has  normal mood and affect. Behavior is normal.     Assessment & Plan:

## 2012-11-10 NOTE — Assessment & Plan Note (Signed)
Minor, to cont efforts at wt loss

## 2012-11-10 NOTE — Assessment & Plan Note (Signed)

## 2012-11-16 ENCOUNTER — Telehealth: Payer: Self-pay

## 2012-11-16 MED ORDER — ATORVASTATIN CALCIUM 40 MG PO TABS: 40.0000 mg | ORAL_TABLET | Freq: Every day | ORAL | Status: DC

## 2012-11-16 NOTE — Telephone Encounter (Signed)
Patient informed and was ok with change.

## 2012-11-16 NOTE — Telephone Encounter (Signed)
Plan does not cover Crestor please advise PA or offer alternative

## 2012-11-16 NOTE — Telephone Encounter (Signed)
Ok to change to lipitor if ok with pt - done erx  Zella Ball to notify pt

## 2012-12-04 ENCOUNTER — Ambulatory Visit (INDEPENDENT_AMBULATORY_CARE_PROVIDER_SITE_OTHER): Payer: BC Managed Care – PPO | Admitting: Gastroenterology

## 2012-12-04 ENCOUNTER — Encounter: Payer: Self-pay | Admitting: Gastroenterology

## 2012-12-04 VITALS — BP 148/96 | HR 80 | Ht 73.0 in | Wt 208.6 lb

## 2012-12-04 DIAGNOSIS — Z86010 Personal history of colonic polyps: Secondary | ICD-10-CM

## 2012-12-04 DIAGNOSIS — R1013 Epigastric pain: Secondary | ICD-10-CM

## 2012-12-04 NOTE — Patient Instructions (Addendum)
Take your Tums and Prilosec as needed.   Call back if your symptoms return.

## 2012-12-04 NOTE — Progress Notes (Addendum)
History of Present Illness: This is a 52 year old male who is changing his care from Dr. Sheryn Bison to me. He had an episode of severe epigastric pain on November 30 and presented to the emergency room. The pain abated after several hours and has not returned. Blood work and an abdominal ultrasound were unremarkable. He's been treated with Prilosec since that time. He states he had one similar episode several months prior this was not quite as severe. Otherwise he has no gastrointestinal complaints. He underwent colonoscopy in December 2011 for screening and 2 small adenomatous colon polyps were found. Denies weight loss, constipation, diarrhea, change in stool caliber, melena, hematochezia, nausea, vomiting, dysphagia, reflux symptoms, chest pain.  Current Medications, Allergies, Past Medical History, Past Surgical History, Family History and Social History were reviewed in [employer] record.  Physical Exam: General: Well developed , well nourished, no acute distress Head: Normocephalic and atraumatic Eyes:  sclerae anicteric, EOMI Ears: Normal auditory acuity Mouth: No deformity or lesions Lungs: Clear throughout to auscultation Heart: Regular rate and rhythm; no murmurs, rubs or bruits Abdomen: Soft, non tender and non distended. No masses, hepatosplenomegaly or hernias noted. Normal Bowel sounds Musculoskeletal: Symmetrical with no gross deformities  Pulses:  Normal pulses noted Extremities: No clubbing, cyanosis, edema or deformities noted Neurological: Alert oriented x 4, grossly nonfocal Psychological:  Alert and cooperative. Normal mood and affect  Assessment and Recommendations:  1. Two episodes of epigastric pain. Currently asymptomatic. Possible acalculous cholecystitis, GERD, gastritis or ulcer. He's been treated with omeprazole for 6 weeks so it is reasonable to discontinue and assess his symptoms. I offered him the option of proceeding with a CCK HIDA  and possibly upper endoscopy for further evaluation however he would like to wait to see if he has recurrent symptoms. I think this is a reasonable plan. If his symptoms do recur I've advised him to restart Prilosec 20 mg daily, use TUMS as needed and to contact us. Of course if his pain is severe he should present to the emergency room.  2. Personal history of adenomatous colon polyps. Surveillance colonoscopy recommended December 2016.

## 2013-01-11 ENCOUNTER — Encounter (HOSPITAL_COMMUNITY): Payer: Self-pay | Admitting: Emergency Medicine

## 2013-01-11 ENCOUNTER — Telehealth: Payer: Self-pay | Admitting: Gastroenterology

## 2013-01-11 ENCOUNTER — Emergency Department (HOSPITAL_COMMUNITY)
Admission: EM | Admit: 2013-01-11 | Discharge: 2013-01-11 | Disposition: A | Discharge status: Home / Self Care | Payer: BC Managed Care – PPO | Attending: Emergency Medicine | Admitting: Emergency Medicine

## 2013-01-11 ENCOUNTER — Encounter: Payer: Self-pay | Admitting: Gastroenterology

## 2013-01-11 ENCOUNTER — Other Ambulatory Visit: Payer: Self-pay

## 2013-01-11 DIAGNOSIS — R1013 Epigastric pain: Secondary | ICD-10-CM

## 2013-01-11 DIAGNOSIS — F329 Major depressive disorder, single episode, unspecified: Secondary | ICD-10-CM | POA: Insufficient documentation

## 2013-01-11 DIAGNOSIS — F3289 Other specified depressive episodes: Secondary | ICD-10-CM | POA: Insufficient documentation

## 2013-01-11 DIAGNOSIS — Z79899 Other long term (current) drug therapy: Secondary | ICD-10-CM | POA: Insufficient documentation

## 2013-01-11 DIAGNOSIS — Z7982 Long term (current) use of aspirin: Secondary | ICD-10-CM | POA: Insufficient documentation

## 2013-01-11 DIAGNOSIS — Z8669 Personal history of other diseases of the nervous system and sense organs: Secondary | ICD-10-CM | POA: Insufficient documentation

## 2013-01-11 DIAGNOSIS — I10 Essential (primary) hypertension: Secondary | ICD-10-CM | POA: Insufficient documentation

## 2013-01-11 DIAGNOSIS — Z8719 Personal history of other diseases of the digestive system: Secondary | ICD-10-CM | POA: Insufficient documentation

## 2013-01-11 DIAGNOSIS — E785 Hyperlipidemia, unspecified: Secondary | ICD-10-CM | POA: Insufficient documentation

## 2013-01-11 LAB — COMPREHENSIVE METABOLIC PANEL
Alkaline Phosphatase: 97 U/L (ref 39–117)
BUN: 19 mg/dL (ref 6–23)
CO2: 26 mEq/L (ref 19–32)
Calcium: 10.8 mg/dL — ABNORMAL HIGH (ref 8.4–10.5)
GFR calc Af Amer: >90 mL/min (ref >90)
GFR calc non Af Amer: 80 mL/min — ABNORMAL LOW (ref >90)
Glucose, Bld: 128 mg/dL — ABNORMAL HIGH (ref 70–99)
Potassium: 3.6 mEq/L (ref 3.5–5.1)
Total Protein: 6.9 g/dL (ref 6.0–8.3)

## 2013-01-11 LAB — URINALYSIS, MICROSCOPIC ONLY
Bilirubin Urine: NEGATIVE
Hgb urine dipstick: NEGATIVE
Nitrite: NEGATIVE
Protein, ur: NEGATIVE mg/dL
Specific Gravity, Urine: 1.023 (ref 1.005–1.030)
Urobilinogen, UA: 0.2 mg/dL (ref 0.0–1.0)

## 2013-01-11 LAB — LIPASE, BLOOD: Lipase: 32 U/L (ref 11–59)

## 2013-01-11 LAB — CBC WITH DIFFERENTIAL/PLATELET
Eosinophils Absolute: 0.2 10*3/uL (ref 0.0–0.7)
Eosinophils Relative: 2 % (ref 0–5)
Hemoglobin: 14.7 g/dL (ref 13.0–17.0)
Lymphocytes Relative: 24 % (ref 12–46)
Lymphs Abs: 2.3 10*3/uL (ref 0.7–4.0)
MCH: 30.8 pg (ref 26.0–34.0)
MCV: 87.9 fL (ref 78.0–100.0)
Monocytes Relative: 5 % (ref 3–12)
RBC: 4.78 MIL/uL (ref 4.22–5.81)

## 2013-01-11 MED ORDER — HYDROCODONE-ACETAMINOPHEN 5-325 MG PO TABS: 2.0000 | ORAL_TABLET | ORAL | Status: DC | PRN

## 2013-01-11 MED ORDER — GI COCKTAIL ~~LOC~~
30.0000 mL | Freq: Once | ORAL | Status: AC
  Administered 2013-01-11: 30 mL via ORAL
  Filled 2013-01-11: qty 30

## 2013-01-11 MED ORDER — DICYCLOMINE HCL 10 MG PO CAPS
20.0000 mg | ORAL_CAPSULE | Freq: Once | ORAL | Status: AC
  Administered 2013-01-11: 20 mg via ORAL
  Filled 2013-01-11: qty 2

## 2013-01-11 MED ORDER — DICYCLOMINE HCL 20 MG PO TABS: 20.0000 mg | ORAL_TABLET | Freq: Four times a day (QID) | ORAL | Status: DC | PRN

## 2013-01-11 MED ORDER — ONDANSETRON HCL 4 MG/2ML IJ SOLN
4.0000 mg | Freq: Once | INTRAMUSCULAR | Status: AC
  Administered 2013-01-11: 4 mg via INTRAVENOUS
  Filled 2013-01-11: qty 2

## 2013-01-11 MED ORDER — MORPHINE SULFATE 4 MG/ML IJ SOLN
4.0000 mg | Freq: Once | INTRAMUSCULAR | Status: AC
  Administered 2013-01-11: 4 mg via INTRAVENOUS
  Filled 2013-01-11: qty 1

## 2013-01-11 MED ORDER — ONDANSETRON HCL 4 MG PO TABS: 4.0000 mg | ORAL_TABLET | Freq: Four times a day (QID) | ORAL | Status: DC

## 2013-01-11 NOTE — ED Notes (Signed)
Pt woke up at 0130 with mid upper abd pain that radiates to LUQ.  Took Prilosec without relief.  Reports mild nausea.  Denies vomiting and diarrhea.  Denies urinary complaints.

## 2013-01-11 NOTE — Telephone Encounter (Signed)
I saw a phone note that you set himup to see Shanda Bumps this week. If he prefers we can proceed directly to the work up I recommended at his office visit. CCK-HIDA and EGD. ----- Message ----- From: SYSTEM Sent: 01/11/2013 7:46 AM To: Meryl Dare, MD,FACG

## 2013-01-11 NOTE — Addendum Note (Signed)
Addended by: Annett Fabian on: 01/11/2013 02:30 PM   Modules accepted: Orders

## 2013-01-11 NOTE — Telephone Encounter (Signed)
Patient advised of Dr. Ardell Isaacs recommendations, he will come for a previsit on 01/13/13 9:00 and EGD 01/19/13 2:30.  He is also scheduled for 01/26/13 7:45 arrival for 8:00 appt in radiology.  He is advised to be NPO after midnight and no pain meds for 48 hours prior.  Patient verbalized understanding of all instructions and that we have cancelled appt with Doug Sou, PA on 01/13/13.

## 2013-01-11 NOTE — ED Provider Notes (Signed)
History     CSN: [id number]  Arrival date & time 01/11/13  0435   First MD Initiated Contact with Patient 01/11/13 [phone]      Chief Complaint  Patient presents with  . Abdominal Pain    (Consider location/radiation/quality/duration/timing/severity/associated sxs/prior treatment) HPI 52 yo male presents to the ER from home with complaint of upper abdominal pain waking from sleep around 130am.  Pt has had similar sxs in the past, seen in the ER in November for same, f/u with GI who recommended if had repeat of pain take prilosec and tums.  Pt took this, but no improvement in pain.  Nausea, no vomiting.  Pain is described as twisting, grinding.  Pain in epigastrium with radiation around to left side.  Pt had neg u/s in Nov.  no fevers no chills, no unusual foods, no travel no sick contacts.  Past Medical History  Diagnosis Date  . ALCOHOL ABUSE, EPISODIC, HX OF 02/15/2008  . ALLERGIC RHINITIS 02/15/2008  . DEPRESSION 07/28/2007  . HEARING LOSS 08/05/2008  . HYPERLIPIDEMIA 02/11/2008  . HYPERTENSION 07/28/2007  . PSA, INCREASED 10/01/2010  . TRANSAMINASES, SERUM, ELEVATED 09/01/2009  . Tubular adenoma of colon 10/2010    Past Surgical History  Procedure Laterality Date  . Left foot surgery  08/2007  . Inguinal herniorrhapy  1976    Family History  Problem Relation Age of Onset  . Depression Mother   . Alcohol abuse Mother   . Hypertension Father   . Prostate cancer Father   . Colon cancer Maternal Grandmother     History  Substance Use Topics  . Smoking status: Never Smoker   . Smokeless tobacco: Never Used  . Alcohol Use: No      Review of Systems  See History of Present Illness; otherwise all other systems are reviewed and negative  Allergies  Review of patient's allergies indicates no known allergies.  Home Medications   Current Outpatient Rx  Name  Route  Sig  Dispense  Refill  . aspirin 325 MG tablet   Oral   Take 325 mg by mouth daily.         Marland Kitchen  atorvastatin (LIPITOR) 40 MG tablet   Oral   Take 1 tablet (40 mg total) by mouth daily.   90 tablet   3   . buPROPion (WELLBUTRIN XL) 300 MG 24 hr tablet   Oral   Take 300 mg by mouth daily.          Marland Kitchen loratadine (CLARITIN) 10 MG tablet   Oral   Take 10 mg by mouth daily.         . mirtazapine (REMERON) 45 MG tablet   Oral   Take 45 mg by mouth at bedtime.          . Omega-3 Fatty Acids (FISH OIL) 1000 MG CAPS   Oral   Take 1 capsule by mouth daily.          Marland Kitchen omeprazole (PRILOSEC) 20 MG capsule   Oral   Take 20 mg by mouth daily as needed. For acid reducer           BP 165/98  Pulse 87  Temp(Src) 98.1 F (36.7 C) (Oral)  Resp 18  SpO2 100%  Physical Exam  Nursing note and vitals reviewed. Constitutional: He is oriented to person, place, and time. He appears well-developed and well-nourished. No distress.  HENT:  Head: Normocephalic and atraumatic.  Right Ear: External ear normal.  Left Ear:  External ear normal.  Nose: Nose normal.  Mouth/Throat: Oropharynx is clear and moist.  Eyes: Conjunctivae and EOM are normal. Pupils are equal, round, and reactive to light.  Neck: Normal range of motion. Neck supple. No JVD present. No tracheal deviation present. No thyromegaly present.  Cardiovascular: Normal rate, regular rhythm, normal heart sounds and intact distal pulses.  Exam reveals no gallop and no friction rub.   No murmur heard. Pulmonary/Chest: Effort normal and breath sounds normal. No stridor. No respiratory distress. He has no wheezes. He has no rales. He exhibits no tenderness.  Abdominal: Soft. Bowel sounds are normal. He exhibits no distension and no mass. There is no tenderness. There is no rebound and no guarding.  Patient indicates epigastrium as the site of pain, no pain with palpation  Musculoskeletal: Normal range of motion. He exhibits no edema and no tenderness.  Lymphadenopathy:    He has no cervical adenopathy.  Neurological: He is  alert and oriented to person, place, and time. He exhibits normal muscle tone. Coordination normal.  Skin: Skin is warm and dry. No rash noted. No erythema. No pallor.  Psychiatric: He has a normal mood and affect. His behavior is normal. Judgment and thought content normal.    ED Course  Procedures (including critical care time)  Labs Reviewed  COMPREHENSIVE METABOLIC PANEL - Abnormal; Notable for the following:    Glucose, Bld 128 (*)    Calcium 10.8 (*)    Total Bilirubin 0.2 (*)    GFR calc non Af Amer 80 (*)    All other components within normal limits  CBC WITH DIFFERENTIAL  LIPASE, BLOOD  URINALYSIS, MICROSCOPIC ONLY   No results found.   1. Epigastric pain       MDM  52 year old male with epigastric pain waking from sleep. Patient has had thorough workup in the past without specific cause for pain. He has been suggested to possibly have a hiatus scan and/or upper endoscopy. We'll check labs to rule out pancreatitis. This is less likely given his no tenderness on physical exam. I do not feel he needs repeat of his ultrasound at this time. Will try to help symptoms with GI cocktail, Bentyl and Zofran.  7:16 AM Pt initially had good pain relief with above cocktail, but pain returned.  Now pain free after morphine.  Will d/c home to f/u with Dr Russella Dar.        Olivia Mackie, MD 01/11/13 [phone]

## 2013-01-11 NOTE — ED Notes (Signed)
Yesterday morning, denies difficulty voiding. Pain is described as a constant grinding

## 2013-01-11 NOTE — Telephone Encounter (Signed)
Patient released from the ER this am after having epigastric pain. He wants to see if he can have the [place] that was discussed with him at the ER.  I have scheduled her to come in and see Doug Sou, PA on 01/13/13.  He was offered an appt earlier, but not able to come.

## 2013-01-13 ENCOUNTER — Encounter: Payer: Self-pay | Admitting: Gastroenterology

## 2013-01-13 ENCOUNTER — Ambulatory Visit: Payer: BC Managed Care – PPO | Admitting: Gastroenterology

## 2013-01-13 ENCOUNTER — Ambulatory Visit (AMBULATORY_SURGERY_CENTER): Payer: BC Managed Care – PPO | Admitting: *Deleted

## 2013-01-13 VITALS — Ht 73.0 in | Wt 209.6 lb

## 2013-01-13 DIAGNOSIS — R1013 Epigastric pain: Secondary | ICD-10-CM

## 2013-01-13 NOTE — Progress Notes (Signed)
Allergic to MSG. No egg or soy allergy. ewm No problems with sedation in the past, ewm

## 2013-01-18 ENCOUNTER — Other Ambulatory Visit: Payer: Self-pay | Admitting: Dermatology

## 2013-01-19 ENCOUNTER — Ambulatory Visit (AMBULATORY_SURGERY_CENTER): Payer: BC Managed Care – PPO | Admitting: Gastroenterology

## 2013-01-19 ENCOUNTER — Encounter: Payer: Self-pay | Admitting: Gastroenterology

## 2013-01-19 VITALS — BP 127/88 | HR 70 | Temp 96.9°F | Resp 0 | Ht 73.0 in | Wt 209.0 lb

## 2013-01-19 DIAGNOSIS — K2970 Gastritis, unspecified, without bleeding: Secondary | ICD-10-CM

## 2013-01-19 DIAGNOSIS — K2990 Gastroduodenitis, unspecified, without bleeding: Secondary | ICD-10-CM

## 2013-01-19 DIAGNOSIS — R1013 Epigastric pain: Secondary | ICD-10-CM

## 2013-01-19 MED ORDER — SODIUM CHLORIDE 0.9 % IV SOLN: 500.0000 mL | INTRAVENOUS | Status: DC

## 2013-01-19 MED ORDER — OMEPRAZOLE 20 MG PO CPDR: 20.0000 mg | DELAYED_RELEASE_CAPSULE | Freq: Every day | ORAL | Status: DC

## 2013-01-19 NOTE — Progress Notes (Signed)
Called to room to assist during endoscopic procedure.  Patient ID and intended procedure confirmed with present staff. Received instructions for my participation in the procedure from the performing physician.  

## 2013-01-19 NOTE — Patient Instructions (Signed)

## 2013-01-19 NOTE — Op Note (Signed)
[facility] [address] [place], [address]   ENDOSCOPY PROCEDURE REPORT  PATIENT: Fred [name], Fred [name]  MR#: [id number] BIRTHDATE: Apr 16, 1961 , 51  yrs. old GENDER: Male ENDOSCOPIST: Meryl Dare, MD, [facility] PROCEDURE DATE:  01/19/2013 PROCEDURE:  EGD w/ biopsy ASA CLASS:     Class II INDICATIONS:  Epigastric pain. MEDICATIONS: MAC sedation, administered by CRNA and propofol (Diprivan) 200mg  IV TOPICAL ANESTHETIC: Cetacaine Spray DESCRIPTION OF PROCEDURE: After the risks benefits and alternatives of the procedure were thoroughly explained, informed consent was obtained.  The LB GIF-H180 [id number] endoscope was introduced through the mouth and advanced to the second portion of the duodenum without limitations.  The instrument was slowly withdrawn as the mucosa was fully examined.  STOMACH: Mild erosive gastritis (inflammation) was found in the gastric antrum and prepyloric region of the stomach.  Multiple biopsies were performed.   The stomach otherwise appeared normal.  ESOPHAGUS: The mucosa of the esophagus appeared normal. DUODENUM: The duodenal mucosa showed no abnormalities in the bulb and second portion of the duodenum.  Retroflexed views revealed no abnormalities.   The scope was then withdrawn from the patient and the procedure completed.  COMPLICATIONS: There were no complications.  ENDOSCOPIC IMPRESSION: 1.   Erosive gastritis in the antrum and prepyloric region; multiple biopsies 2.   The EGD otherwise appeared normal  RECOMMENDATIONS: 1.  Avoid ASA/NSAIDS for 2 weeks, consider ASA 81 mg daily, if ASA recommended other MDs, after 2 weeks 2.  Await pathology results 3.  Continue PPI    eSigned:  Meryl Dare, MD, [facility] 01/19/2013 3:01 PM

## 2013-01-19 NOTE — Progress Notes (Signed)
Patient did not experience any of the following events: a burn prior to discharge; a fall within the facility; wrong site/side/patient/procedure/implant event; or a hospital transfer or hospital admission upon discharge from the facility. (G8907) Patient did not have preoperative order for IV antibiotic SSI prophylaxis. (G8918)  

## 2013-01-20 ENCOUNTER — Telehealth: Payer: Self-pay

## 2013-01-20 NOTE — Telephone Encounter (Signed)
  Follow up Call-  Call back number 01/19/2013  Post procedure Call Back phone  # [phone]  Permission to leave phone message Yes     Patient questions:  Do you have a fever, pain , or abdominal swelling? no Pain Score  0 *  Have you tolerated food without any problems? yes  Have you been able to return to your normal activities? yes  Do you have any questions about your discharge instructions: Diet   no Medications  no Follow up visit  no  Do you have questions or concerns about your Care? no  Actions: * If pain score is 4 or above: No action needed, pain <4.  No problems per the pt. Maw

## 2013-01-26 ENCOUNTER — Encounter (HOSPITAL_COMMUNITY)
Admission: RE | Admit: 2013-01-26 | Discharge: 2013-01-26 | Disposition: A | Discharge status: Home / Self Care | Payer: BC Managed Care – PPO | Source: Ambulatory Visit | Attending: Gastroenterology | Admitting: Gastroenterology

## 2013-01-26 ENCOUNTER — Encounter: Payer: Self-pay | Admitting: Gastroenterology

## 2013-01-26 DIAGNOSIS — R1013 Epigastric pain: Secondary | ICD-10-CM | POA: Insufficient documentation

## 2013-01-26 MED ORDER — TECHNETIUM TC 99M MEBROFENIN IV KIT
5.5000 | PACK | Freq: Once | INTRAVENOUS | Status: AC | PRN
  Administered 2013-01-26: 6 via INTRAVENOUS

## 2013-02-08 ENCOUNTER — Telehealth: Payer: Self-pay | Admitting: Gastroenterology

## 2013-02-08 NOTE — Telephone Encounter (Signed)
Patient had an "episode' of abdominal pain yesterday that lasted about 4 hours.  Dr. Russella Dar dignosed the patient with gastritis and advised him that he should take PPI qd long term.  Today the patient has no GI complaints.  He is asking what he needs to do to prevent further episodes.  He is advised that for about 1 week he can increase his omeprazole to BID.  Patient instructed to maintain an anti-reflux diet. Advised to avoid caffeine, mint, citrus foods/juices, tomatoes,  chocolate, NSAIDS/ASA products.  Instructed not to eat within 2 hours of exercise or bed, multiple small meals are better than 3 large meals.  Need to take PPI 30 minutes prior to 1st meal of the day and at HS.  He will call back for an office visit if dietary changes and PPI changes do not help.

## 2013-03-24 ENCOUNTER — Other Ambulatory Visit: Payer: Self-pay | Admitting: Internal Medicine

## 2013-07-20 ENCOUNTER — Telehealth: Payer: Self-pay | Admitting: Internal Medicine

## 2013-07-20 MED ORDER — CIPROFLOXACIN HCL 500 MG PO TABS: 500.0000 mg | ORAL_TABLET | Freq: Two times a day (BID) | ORAL | Status: DC

## 2013-07-20 NOTE — Telephone Encounter (Signed)
Done to [facility] 

## 2013-07-20 NOTE — Telephone Encounter (Signed)
Pt stated he will be traveling to [place] on 08/06/13 and he was wondering if Dr. Jonny Ruiz can give him some cipro. Please advise.

## 2013-07-21 NOTE — Telephone Encounter (Signed)
Patient informed. 

## 2013-09-30 ENCOUNTER — Other Ambulatory Visit: Payer: Self-pay

## 2013-10-17 ENCOUNTER — Encounter (HOSPITAL_COMMUNITY): Payer: Self-pay | Admitting: Emergency Medicine

## 2013-10-17 ENCOUNTER — Inpatient Hospital Stay (HOSPITAL_COMMUNITY)
Admission: EM | Admit: 2013-10-17 | Discharge: 2013-10-20 | DRG: 690 | Disposition: A | Discharge status: Home / Self Care | Payer: BC Managed Care – PPO | Attending: Urology | Admitting: Urology

## 2013-10-17 DIAGNOSIS — R319 Hematuria, unspecified: Secondary | ICD-10-CM | POA: Diagnosis present

## 2013-10-17 DIAGNOSIS — R5381 Other malaise: Secondary | ICD-10-CM | POA: Diagnosis present

## 2013-10-17 DIAGNOSIS — R509 Fever, unspecified: Secondary | ICD-10-CM

## 2013-10-17 DIAGNOSIS — E785 Hyperlipidemia, unspecified: Secondary | ICD-10-CM | POA: Diagnosis present

## 2013-10-17 DIAGNOSIS — N390 Urinary tract infection, site not specified: Principal | ICD-10-CM | POA: Diagnosis present

## 2013-10-17 DIAGNOSIS — H9190 Unspecified hearing loss, unspecified ear: Secondary | ICD-10-CM | POA: Diagnosis present

## 2013-10-17 DIAGNOSIS — R972 Elevated prostate specific antigen [PSA]: Secondary | ICD-10-CM | POA: Diagnosis present

## 2013-10-17 DIAGNOSIS — Z860100 Personal history of colon polyps, unspecified: Secondary | ICD-10-CM

## 2013-10-17 DIAGNOSIS — Z800 Family history of malignant neoplasm of digestive organs: Secondary | ICD-10-CM

## 2013-10-17 DIAGNOSIS — Z8249 Family history of ischemic heart disease and other diseases of the circulatory system: Secondary | ICD-10-CM

## 2013-10-17 DIAGNOSIS — A498 Other bacterial infections of unspecified site: Secondary | ICD-10-CM | POA: Diagnosis present

## 2013-10-17 DIAGNOSIS — Z8042 Family history of malignant neoplasm of prostate: Secondary | ICD-10-CM

## 2013-10-17 DIAGNOSIS — R000 Tachycardia, unspecified: Secondary | ICD-10-CM | POA: Diagnosis present

## 2013-10-17 DIAGNOSIS — I10 Essential (primary) hypertension: Secondary | ICD-10-CM | POA: Diagnosis present

## 2013-10-17 LAB — BASIC METABOLIC PANEL
BUN: 24 mg/dL — ABNORMAL HIGH (ref 6–23)
Calcium: 8.9 mg/dL (ref 8.4–10.5)
Chloride: 101 mEq/L (ref 96–112)
Creatinine, Ser: 1.25 mg/dL (ref 0.50–1.35)
GFR calc Af Amer: 74 mL/min — ABNORMAL LOW (ref >90)
GFR calc non Af Amer: 64 mL/min — ABNORMAL LOW (ref >90)

## 2013-10-17 LAB — CBC WITH DIFFERENTIAL/PLATELET
Basophils Absolute: 0 10*3/uL (ref 0.0–0.1)
Basophils Relative: 0 % (ref 0–1)
Eosinophils Absolute: 0 10*3/uL (ref 0.0–0.7)
Eosinophils Relative: 0 % (ref 0–5)
HCT: 42.1 % (ref 39.0–52.0)
Hemoglobin: 14.8 g/dL (ref 13.0–17.0)
MCH: 31.2 pg (ref 26.0–34.0)
MCHC: 35.2 g/dL (ref 30.0–36.0)
MCV: 88.8 fL (ref 78.0–100.0)
Monocytes Absolute: 0.6 10*3/uL (ref 0.1–1.0)
Monocytes Relative: 5 % (ref 3–12)
Neutro Abs: 10.2 10*3/uL — ABNORMAL HIGH (ref 1.7–7.7)
RDW: 13.1 % (ref 11.5–15.5)

## 2013-10-17 LAB — URINALYSIS, ROUTINE W REFLEX MICROSCOPIC
Bilirubin Urine: NEGATIVE
Glucose, UA: NEGATIVE mg/dL
Ketones, ur: NEGATIVE mg/dL
Nitrite: NEGATIVE
Specific Gravity, Urine: 1.033 — ABNORMAL HIGH (ref 1.005–1.030)
Urobilinogen, UA: 0.2 mg/dL (ref 0.0–1.0)

## 2013-10-17 LAB — LACTIC ACID, PLASMA: Lactic Acid, Venous: 1.3 mmol/L (ref 0.5–2.2)

## 2013-10-17 LAB — URINE MICROSCOPIC-ADD ON

## 2013-10-17 MED ORDER — PIPERACILLIN-TAZOBACTAM 3.375 G IVPB
3.3750 g | Freq: Three times a day (TID) | INTRAVENOUS | Status: DC
  Administered 2013-10-17 – 2013-10-20 (×9): 3.375 g via INTRAVENOUS
  Filled 2013-10-17 (×11): qty 50

## 2013-10-17 MED ORDER — GENTAMICIN SULFATE 40 MG/ML IJ SOLN
630.0000 mg | INTRAVENOUS | Status: DC
  Administered 2013-10-17 – 2013-10-19 (×3): 630 mg via INTRAVENOUS
  Filled 2013-10-17 (×4): qty 15.75

## 2013-10-17 MED ORDER — SODIUM CHLORIDE 0.9 % IV SOLN: 250.0000 mL | INTRAVENOUS | Status: DC | PRN

## 2013-10-17 MED ORDER — BUPROPION HCL ER (XL) 300 MG PO TB24
300.0000 mg | ORAL_TABLET | Freq: Every day | ORAL | Status: DC
  Administered 2013-10-17 – 2013-10-20 (×4): 300 mg via ORAL
  Filled 2013-10-17 (×5): qty 1

## 2013-10-17 MED ORDER — SODIUM CHLORIDE 0.9 % IV BOLUS (SEPSIS)
1000.0000 mL | Freq: Once | INTRAVENOUS | Status: AC
  Administered 2013-10-17: 1000 mL via INTRAVENOUS

## 2013-10-17 MED ORDER — ATORVASTATIN CALCIUM 40 MG PO TABS
40.0000 mg | ORAL_TABLET | Freq: Every day | ORAL | Status: DC
  Administered 2013-10-17 – 2013-10-20 (×4): 40 mg via ORAL
  Filled 2013-10-17 (×5): qty 1

## 2013-10-17 MED ORDER — ZOLPIDEM TARTRATE 5 MG PO TABS
5.0000 mg | ORAL_TABLET | Freq: Every evening | ORAL | Status: DC | PRN
  Administered 2013-10-17 – 2013-10-19 (×3): 5 mg via ORAL
  Filled 2013-10-17 (×3): qty 1

## 2013-10-17 MED ORDER — DEXTROSE 5 % IV SOLN
3.3750 g | Freq: Once | INTRAVENOUS | Status: AC
  Administered 2013-10-17: 3.375 g via INTRAVENOUS
  Filled 2013-10-17: qty 3.38

## 2013-10-17 MED ORDER — IBUPROFEN 800 MG PO TABS
400.0000 mg | ORAL_TABLET | Freq: Four times a day (QID) | ORAL | Status: DC | PRN
Start: 2013-10-17 — End: 2013-10-20
  Administered 2013-10-17 – 2013-10-19 (×9): 400 mg via ORAL
  Filled 2013-10-17 (×5): qty 1
  Filled 2013-10-17: qty 0.5
  Filled 2013-10-17 (×3): qty 1

## 2013-10-17 MED ORDER — SODIUM CHLORIDE 0.9 % IJ SOLN
3.0000 mL | Freq: Two times a day (BID) | INTRAMUSCULAR | Status: DC
  Administered 2013-10-17: 3 mL via INTRAVENOUS

## 2013-10-17 MED ORDER — SODIUM CHLORIDE 0.9 % IJ SOLN: 3.0000 mL | INTRAMUSCULAR | Status: DC | PRN

## 2013-10-17 MED ORDER — MIRTAZAPINE 45 MG PO TABS
45.0000 mg | ORAL_TABLET | Freq: Every day | ORAL | Status: DC
  Administered 2013-10-17 – 2013-10-19 (×3): 45 mg via ORAL
  Filled 2013-10-17 (×4): qty 1

## 2013-10-17 MED ORDER — IBUPROFEN 800 MG PO TABS
800.0000 mg | ORAL_TABLET | Freq: Once | ORAL | Status: AC
  Administered 2013-10-17: 800 mg via ORAL
  Filled 2013-10-17: qty 1

## 2013-10-17 NOTE — Progress Notes (Signed)
ANTIBIOTIC CONSULT NOTE - INITIAL  Pharmacy Consult for Gentamicin, renal adjustment of antibiotics Indication: prostate infection  No Known Allergies  Patient Measurements: Height: 6\' 1"  (185.4 cm) Weight: 198 lb (89.812 kg) IBW/kg (Calculated) : 79.9  Vital Signs: Temp: 100.5 F (38.1 C) (11/23 0733) Temp src: Oral (11/23 0733) BP: 149/87 mmHg (11/23 0900) Pulse Rate: 120 (11/23 0733) Intake/Output from previous day:   Intake/Output from this shift:    Labs:  Recent Labs  10/17/13 0635  WBC 11.6*  HGB 14.8  PLT 213  CREATININE 1.25   Estimated Creatinine Clearance: 77.2 ml/min (by C-G formula based on Cr of 1.25). No results found for this basename: Rolm [place]ala, [place], GENTTROUGH, GENTPEAK, GENTRANDOM, TOBRATROUGH, TOBRAPEAK, TOBRARND, AMIKACINPEAK, AMIKACINTROU, AMIKACIN,  in the last 72 hours    Assessment: 60 yom s/p prostate biopsy Friday (11/21) by Dr. Isabel Caprice presented 11/23 with c/o fever, chills, tachycardia. Pt reported taking pre and post biopsy abx (Levaquin) as directed. UA c/w infection. Urology order to start both Gentamicin (per Rx) and Zosyn (per MD).   11/23 >> Gent >>  11/23 >> Zosyn (MD) >>   Tmax: 102.5 WBCs: 11.6K  Renal: Scr 1.25, CG 77, N 70  11/23 Urine >> in process 11/23 Blood x 2 >> in process  Goal of Therapy:  Desired Peak: 6-8 mg/L Desired Trough: < 2 mg/L  Plan:   Gentamicin 630 mg IV q24h (7mg /kg based on total body weight)  10-hr random gentamicin level (~2000)  Continue Zosyn as ordered by MD  Consider narrowing antibiotics quickly  Geoffry Paradise, PharmD, BCPS Pager: [phone] 9:15 AM Pharmacy #: [id number]

## 2013-10-17 NOTE — H&P (Signed)
CC: fever  HPI: Fred [name] is a pleasant 52 y/o M with a h/o fever who is seen in the emergency department for evaluation of fever and shaking chills. He underwent a prostate biopsy on Friday for an elevated PSA to 8. He received peri-procedure levaquin and ceftriaxone. At about 4am this morning he developed shaking chills and a fever. He has felt overall poorly and fatigued since the biopsy. He recently has traveled to [place]. He has had a prostate biopsy in the past but no complications after that one. + hematuria, no dysuria. Some mild hematochezia that has resolved. No flank pain, CP, SOB.  Past Medical History  Diagnosis Date  . ALCOHOL ABUSE, EPISODIC, HX OF 02/15/2008  . ALLERGIC RHINITIS 02/15/2008  . DEPRESSION 07/28/2007  . HEARING LOSS 08/05/2008  . HYPERLIPIDEMIA 02/11/2008  . HYPERTENSION 07/28/2007  . PSA, INCREASED 10/01/2010  . TRANSAMINASES, SERUM, ELEVATED 09/01/2009  . Tubular adenoma of colon 10/2010   Past Surgical History  Procedure Laterality Date  . Left foot surgery  08/2007  . Inguinal herniorrhapy  1976  . Colonoscopy    . Polypectomy     No Known Allergies Scheduled Meds: . sodium chloride  3 mL Intravenous Q12H   Continuous Infusions: . sodium chloride    . gentamicin    . piperacillin-tazobactam (ZOSYN)  IV     PRN Meds:.sodium chloride, sodium chloride Atorvastatin wellbutrin Mirtazapine  Family History  Problem Relation Age of Onset  . Depression Mother   . Alcohol abuse Mother   . Hypertension Father   . Prostate cancer Father   . Colon cancer Maternal Grandmother   . Rectal cancer Neg Hx   . Stomach cancer Neg Hx   . Esophageal cancer Neg Hx    History   Social History  . Marital Status: Single    Spouse Name: N/A    Number of Children: N/A  . Years of Education: N/A   Occupational History  . CPA health care finance    Social History Main Topics  . Smoking status: Never Smoker   . Smokeless tobacco: Never Used  . Alcohol Use: No  .  Drug Use: No  . Sexual Activity: Not on file   Other Topics Concern  . Not on file   Social History Narrative  . No narrative on file   Ros: ten point review of systems is negative except for pertinent positives and negatives noted in HPI  PE: Filed Vitals:   10/17/13 0900  BP: 149/87  Pulse:   Temp:   Resp: 18  pulse 116, BP 140/80  gen - pleasant male in NAD, lying in bed Psych - appropriate mood and affect, cooperative Neuro - CN II-XII grossly intact Chest - CTAB CV - tachy, reg rhythm abd - soft, NTND, no CVAT gu - penis circ'ed, no lesions, testicles WNL Rectal - deferred due to suspected infection Ext - wwp Musk - normal ROM in all 4 ext's  Labs: WBC 11.6 Cr 1.2 Blood cx, urine cx pending U/a: cloudy, large blood, neg nitrite, large LE  A/P: 52 y/o M with tachycardia, leukocytosis c/w mild sepsis after prostate biopsy  - sepsis from prostate biopsy: will start broad spectrum Abx given recent travel and presentation after levaquin/rocephin - will use zosyn and gent. Can consider amp vs vanc for enterococcus coverage if spikes more fevers - resume home medicines - IVF at 100 - enc amb, SCDs Disp: floor

## 2013-10-17 NOTE — ED Notes (Signed)
Bed: JX91 Expected date: 10/17/13 Expected time: 5:35 AM Means of arrival: Ambulance Comments: Fever, chills, tachycardic s/p bladder biopsy

## 2013-10-17 NOTE — ED Provider Notes (Signed)
CSN: [id number]     Arrival date & time 10/17/13  4098 History   None    Chief Complaint  Patient presents with  . Fever   (Consider location/radiation/quality/duration/timing/severity/associated sxs/prior Treatment) The history is provided by the patient and medical records.   This is a 52 year old male with past medical history significant for depression, hypertension, hyperlipidemia, presenting to the emergency department for fever. The patient had a prostate biopsy performed on 10/15/2013 by Dr. Isabel Caprice.  States procedure was without complication and he took his pre-and post antibiotics (Levaquin) as directed. Pt states he was also given an IM shot of some abx in the office as he recently traveled to [place]-- cannot recall what the medication was.  States he woke up last night in a pool of sweat with cold chills, and had a fever.  Called physician on call for urology office and instructed to come to the ED.  Denies any difficulty with urination.  Past Medical History  Diagnosis Date  . ALCOHOL ABUSE, EPISODIC, HX OF 02/15/2008  . ALLERGIC RHINITIS 02/15/2008  . DEPRESSION 07/28/2007  . HEARING LOSS 08/05/2008  . HYPERLIPIDEMIA 02/11/2008  . HYPERTENSION 07/28/2007  . PSA, INCREASED 10/01/2010  . TRANSAMINASES, SERUM, ELEVATED 09/01/2009  . Tubular adenoma of colon 10/2010   Past Surgical History  Procedure Laterality Date  . Left foot surgery  08/2007  . Inguinal herniorrhapy  1976  . Colonoscopy    . Polypectomy     Family History  Problem Relation Age of Onset  . Depression Mother   . Alcohol abuse Mother   . Hypertension Father   . Prostate cancer Father   . Colon cancer Maternal Grandmother   . Rectal cancer Neg Hx   . Stomach cancer Neg Hx   . Esophageal cancer Neg Hx    History  Substance Use Topics  . Smoking status: Never Smoker   . Smokeless tobacco: Never Used  . Alcohol Use: No    Review of Systems  Constitutional: Positive for fever.  All other systems reviewed  and are negative.    Allergies  Review of patient's allergies indicates no known allergies.  Home Medications   Current Outpatient Rx  Name  Route  Sig  Dispense  Refill  . aspirin 325 MG tablet   Oral   Take 325 mg by mouth daily.         Marland Kitchen atorvastatin (LIPITOR) 40 MG tablet   Oral   Take 1 tablet (40 mg total) by mouth daily.   90 tablet   3   . buPROPion (WELLBUTRIN XL) 300 MG 24 hr tablet   Oral   Take 300 mg by mouth daily.          . ciprofloxacin (CIPRO) 500 MG tablet   Oral   Take 1 tablet (500 mg total) by mouth 2 (two) times daily.   20 tablet   0   . dicyclomine (BENTYL) 20 MG tablet   Oral   Take 1 tablet (20 mg total) by mouth every 6 (six) hours as needed (for abdominal cramping).   20 tablet   0   . HYDROcodone-acetaminophen (NORCO/VICODIN) 5-325 MG per tablet   Oral   Take 2 tablets by mouth every 4 (four) hours as needed for pain.   20 tablet   0   . loratadine (CLARITIN) 10 MG tablet   Oral   Take 10 mg by mouth daily.         . mirtazapine (REMERON)  45 MG tablet   Oral   Take 45 mg by mouth at bedtime.          . Omega-3 Fatty Acids (FISH OIL) 1000 MG CAPS   Oral   Take 1 capsule by mouth daily.          Marland Kitchen omeprazole (PRILOSEC) 20 MG capsule   Oral   Take 1 capsule (20 mg total) by mouth daily. For acid reducer   31 capsule   11   . ondansetron (ZOFRAN) 4 MG tablet   Oral   Take 1 tablet (4 mg total) by mouth every 6 (six) hours.   12 tablet   0   . PROCTOZONE-HC 2.5 % rectal cream      USE CREAM RECTALLY TWICE DAILY   30 g   0    BP 158/92  Pulse 144  Temp(Src) 100.3 F (37.9 C) (Oral)  Resp 24  SpO2 100%  Physical Exam  Nursing note and vitals reviewed. Constitutional: He is oriented to person, place, and time. He appears well-developed and well-nourished. No distress.  HENT:  Head: Normocephalic and atraumatic.  Mouth/Throat: Oropharynx is clear and moist.  Eyes: Conjunctivae and EOM are normal.  Pupils are equal, round, and reactive to light.  Neck: Normal range of motion. Neck supple.  Cardiovascular: Normal rate, regular rhythm and normal heart sounds.   Pulmonary/Chest: Effort normal and breath sounds normal. No respiratory distress. He has no wheezes.  Abdominal: Soft. Bowel sounds are normal. There is no tenderness. There is no guarding.  Musculoskeletal: Normal range of motion. He exhibits no edema.  Neurological: He is alert and oriented to person, place, and time.  Skin: Skin is warm and dry. He is not diaphoretic.  Psychiatric: He has a normal mood and affect.    ED Course  Procedures (including critical care time)  Labs Review Labs Reviewed  CBC WITH DIFFERENTIAL - Abnormal; Notable for the following:    WBC 11.6 (*)    Neutrophils Relative % 88 (*)    Neutro Abs 10.2 (*)    Lymphocytes Relative 6 (*)    All other components within normal limits  BASIC METABOLIC PANEL - Abnormal; Notable for the following:    Glucose, Bld 121 (*)    BUN 24 (*)    GFR calc non Af Amer 64 (*)    GFR calc Af Amer 74 (*)    All other components within normal limits  URINALYSIS, ROUTINE W REFLEX MICROSCOPIC - Abnormal; Notable for the following:    APPearance CLOUDY (*)    Specific Gravity, Urine 1.033 (*)    Hgb urine dipstick LARGE (*)    Protein, ur 30 (*)    Leukocytes, UA LARGE (*)    All other components within normal limits  URINE MICROSCOPIC-ADD ON - Abnormal; Notable for the following:    Bacteria, UA MANY (*)    All other components within normal limits  URINE CULTURE  CULTURE, BLOOD (ROUTINE X 2)  CULTURE, BLOOD (ROUTINE X 2)  LACTIC ACID, PLASMA   Imaging Review No results found.  EKG Interpretation   None       MDM   1. UTI (lower urinary tract infection)   2. Fever   3. Tachycardia     Pt febrile and tachycardic on arrival at 102.51F rectally and 145 respectively.  Pt given IVF, motrin, and labs obtained.  Will reassess.  Labs as above, mild  leukocytosis at 11.6, lactate WNL.  Renal function  preserved.  U/a with signs of infection.  Fever reduced, HR slowed but still tachycardiac at 120.  Consulte urology, Dr. Loletta Parish to obtain blood cultures and give dose of zosyn, he will call back later for an update.  Dr. Yevonne Aline evaluated pt in the ED-- will admit for observation and continued abx.  Garlon Hatchet, PA-C 10/17/13 [phone]

## 2013-10-17 NOTE — ED Notes (Signed)
Per EMS, pt had prostate bx Friday.  Tonight pt developed fever, chills.  Notified MD and advised to come to ED.  No other symptoms.  Vitals:  188/130, hr 156 initial.  Now 143 hr, 177/111. 18g LAC

## 2013-10-17 NOTE — ED Provider Notes (Signed)
Medical screening examination/treatment/procedure(s) were performed by non-physician practitioner and as supervising physician I was immediately available for consultation/collaboration.  EKG Interpretation    Date/Time:    Ventricular Rate:    PR Interval:    QRS Duration:   QT Interval:    QTC Calculation:   R Axis:     Text Interpretation:               Derwood Kaplan, MD 10/17/13 2137

## 2013-10-18 LAB — CBC
HCT: 40.5 % (ref 39.0–52.0)
MCH: 30.3 pg (ref 26.0–34.0)
MCV: 89.6 fL (ref 78.0–100.0)
Platelets: 165 10*3/uL (ref 150–400)
RDW: 13.4 % (ref 11.5–15.5)

## 2013-10-18 LAB — BASIC METABOLIC PANEL
CO2: 25 mEq/L (ref 19–32)
Calcium: 8.8 mg/dL (ref 8.4–10.5)
Chloride: 103 mEq/L (ref 96–112)
Creatinine, Ser: 1.32 mg/dL (ref 0.50–1.35)
Glucose, Bld: 122 mg/dL — ABNORMAL HIGH (ref 70–99)

## 2013-10-18 NOTE — Progress Notes (Signed)
Patient ID: Fred [name], male   DOB: 03-20-1961, 52 y.o.   MRN: [id number]   Subjective: Patient reports feeling better. He still had some fever yesterday with chills. He continues to complain of some mild diffuse achiness. No voiding difficulties and no dysuria or hematuria.  Objective: Vital signs in last 24 hours: Temp:  [97.5 F (36.4 C)-101.9 F (38.8 C)] 99.1 F (37.3 C) (11/24 0601) Pulse Rate:  [98-120] 98 (11/24 0601) Resp:  [18-22] 18 (11/24 0601) BP: (128-160)/(73-92) 128/73 mmHg (11/24 0601) SpO2:  [97 %-100 %] 97 % (11/24 0601) Weight:  [89.812 kg (198 lb)-94.167 kg (207 lb 9.6 oz)] 94.167 kg (207 lb 9.6 oz) (11/23 0945)  Intake/Output from previous day: 11/23 0701 - 11/24 0700 In: 580 [P.O.:480; IV Piggyback:100] Out: 950 [Urine:950] Intake/Output this shift:    Physical Exam:  Constitutional: Vital signs reviewed. WD WN in NAD   Eyes: PERRL, No scleral icterus.   Cardiovascular: RRR Pulmonary/Chest: Normal effort Abdominal: Soft. Non-tender, non-distended, bowel sounds are normal, no masses, organomegaly, or guarding present.  Genitourinary:not examined Extremities: No cyanosis or edema   Lab Results:  Recent Labs  10/17/13 0635 10/18/13 0400  HGB 14.8 13.7  HCT 42.1 40.5   BMET  Recent Labs  10/17/13 0635 10/18/13 0400  NA 135 137  K 3.7 3.4*  CL 101 103  CO2 24 25  GLUCOSE 121* 122*  BUN 24* 13  CREATININE 1.25 1.32  CALCIUM 8.9 8.8   No results found for this basename: LABPT, INR,  in the last 72 hours No results found for this basename: LABURIN,  in the last 72 hours Results for orders placed during the hospital encounter of 01/04/11  SURGICAL PCR SCREEN     Status: None   Collection Time    12/31/10 11:50 AM      Result Value Range Status   MRSA, PCR NEGATIVE  NEGATIVE Final   Staphylococcus aureus    NEGATIVE Final   Value: NEGATIVE            The Xpert SA Assay (FDA     approved for NASAL specimens     only), is one  component of     a comprehensive surveillance     program.  It is not intended     to diagnose infection nor to     guide or monitor treatment.    Studies/Results: No results found.  Assessment/Plan:   Acute prostatitis/febrile urinary tract infection status post prostate biopsy. We'll keep him on broad-spectrum IV antibiotics until blood and urine cultures are available. We'll then change to appropriate oral antibiotic if available. If not patient may require PIC line and ongoing IV antibiotics potentially thru home health.   LOS: 1 day   Cadence Minton S 10/18/2013, 7:27 AM

## 2013-10-18 NOTE — Progress Notes (Signed)
ANTIBIOTIC CONSULT NOTE - FOLLOW UP  Pharmacy Consult for Gentamicin, renal adjustment of antibiotics Indication: prostate infection  No Known Allergies  Patient Measurements: Height: 6\' 1"  (185.4 cm) Weight: 207 lb 9.6 oz (94.167 kg) IBW/kg (Calculated) : 79.9  Vital Signs: Temp: 100.4 F (38 C) (11/23 2300) Temp src: Oral (11/23 2300) Intake/Output from previous day: 11/23 0701 - 11/24 0700 In: 480 [P.O.:480] Out: 475 [Urine:475] Intake/Output from this shift:    Labs:  Recent Labs  10/17/13 0635  WBC 11.6*  HGB 14.8  PLT 213  CREATININE 1.25   Estimated Creatinine Clearance: 77.2 ml/min (by C-G formula based on Cr of 1.25).  Recent Labs  10/17/13 2200  GENTRANDOM 1.6      Assessment: 52 yom s/p prostate biopsy Friday (11/21) by Dr. Isabel Caprice presented 11/23 with c/o fever, chills, tachycardia. Pt reported taking pre and post biopsy abx (Levaquin) as directed. UA c/w infection. Urology order to start both Gentamicin (per Rx) and Zosyn (per MD).   11/23 >> Gent >>  11/23 >> Zosyn (MD) >>   T (currently): 100.4 WBCs: 11.6K  Renal: Scr 1.25, CG 77, N 70  11/23 Urine >> in process 11/23 Blood x 2 >> in process  10 hr random gentamicin level = 1.6  Goal of Therapy:  10 hr random level per [place] nomogram for extended interval dosing  Plan:   Continue Gentamicin 630 mg IV q24h (7mg /kg based on total body weight)  Continue Zosyn as ordered by MD  Consider narrowing antibiotics quickly  Terrilee Files, PharmD 10/18/13 1:41 AM

## 2013-10-19 MED ORDER — LORATADINE 10 MG PO TABS
10.0000 mg | ORAL_TABLET | Freq: Every day | ORAL | Status: DC
  Administered 2013-10-19 – 2013-10-20 (×2): 10 mg via ORAL
  Filled 2013-10-19 (×2): qty 1

## 2013-10-19 NOTE — Progress Notes (Signed)
Patient ID: Fred [name], male   DOB: 07/16/1961, 52 y.o.   MRN: [id number]   Subjective: Patient reports Feeling much better today.  White blood cell count is now normal.  His temperature curve has improved.  He has no new complaints or concerns today.  Urine culture is positive for Escherichia coli.  Sensitivities remain pending.  Objective: Vital signs in last 24 hours: Temp:  [98.7 F (37.1 C)-102.3 F (39.1 C)] 99.8 F (37.7 C) (11/25 0751) Pulse Rate:  [95-108] 97 (11/25 0751) Resp:  [18] 18 (11/25 0751) BP: (151-155)/(89-96) 151/91 mmHg (11/25 0751) SpO2:  [97 %] 97 % (11/25 0751)  Intake/Output from previous day: 11/24 0701 - 11/25 0700 In: 811.5 [P.O.:480; IV Piggyback:331.5] Out: 1750 [Urine:1750] Intake/Output this shift: Total I/O In: 165.8 [IV Piggyback:165.8] Out: 300 [Urine:300]  Physical Exam:  Constitutional: Vital signs reviewed. WD WN in NAD   Eyes: PERRL, No scleral icterus.   Cardiovascular: RRR Pulmonary/Chest: Normal effort Abdominal: Soft. Non-tender Extremities: No cyanosis or edema   Lab Results:  Recent Labs  10/17/13 0635 10/18/13 0400  HGB 14.8 13.7  HCT 42.1 40.5   BMET  Recent Labs  10/17/13 0635 10/18/13 0400  NA 135 137  K 3.7 3.4*  CL 101 103  CO2 24 25  GLUCOSE 121* 122*  BUN 24* 13  CREATININE 1.25 1.32  CALCIUM 8.9 8.8   No results found for this basename: LABPT, INR,  in the last 72 hours No results found for this basename: LABURIN,  in the last 72 hours Results for orders placed during the hospital encounter of 10/17/13  URINE CULTURE     Status: None   Collection Time    10/17/13  6:15 AM      Result Value Range Status   Specimen Description URINE, CLEAN CATCH   Final   Special Requests NONE   Final   Culture  Setup Time     Final   Value: 10/17/2013 17:10     Performed at [facility] Count     Final   Value: >=100,000 COLONIES/ML     Performed at [facility]   Culture     Final    Value: ESCHERICHIA COLI     Performed at [facility]   Report Status PENDING   Incomplete  CULTURE, BLOOD (ROUTINE X 2)     Status: None   Collection Time    10/17/13  8:05 AM      Result Value Range Status   Specimen Description BLOOD LEFT HAND   Final   Special Requests BOTTLES DRAWN AEROBIC AND ANAEROBIC 1.5CC EACH   Final   Culture  Setup Time     Final   Value: 10/17/2013 15:15     Performed at [facility]   Culture     Final   Value:        BLOOD CULTURE RECEIVED NO GROWTH TO DATE CULTURE WILL BE HELD FOR 5 DAYS BEFORE ISSUING A FINAL NEGATIVE REPORT     Performed at [facility]   Report Status PENDING   Incomplete  CULTURE, BLOOD (ROUTINE X 2)     Status: None   Collection Time    10/17/13  8:20 AM      Result Value Range Status   Specimen Description BLOOD RIGHT HAND   Final   Special Requests BOTTLES DRAWN AEROBIC AND ANAEROBIC .[facility] EACH   Final   Culture  Setup Time  Final   Value: 10/17/2013 15:14     Performed at [facility]   Culture     Final   Value:        BLOOD CULTURE RECEIVED NO GROWTH TO DATE CULTURE WILL BE HELD FOR 5 DAYS BEFORE ISSUING A FINAL NEGATIVE REPORT     Performed at [facility]   Report Status PENDING   Incomplete    Studies/Results: No results found.  Assessment/Plan:   Acute prostatitis status Post prostate ultrasound and biopsy.  Blood cultures thus far are negative.  Urine culture does demonstrate Escherichia coli which undoubtedly will be fluoroquinolone resistant.  We could not discharge him on oral antibiotics until sensitivities are available.  If no oral agent will be effective he may have to have a PICC line with parental antibiotics through home health.  Hopefully we should have the results by tomorrow morning and will anticipate discharge at that time.   LOS: 2 days   Jonte Wollam S 10/19/2013, 12:49 PM

## 2013-10-20 LAB — URINE CULTURE: Colony Count: >=100000

## 2013-10-20 MED ORDER — SULFAMETHOXAZOLE-TMP DS 800-160 MG PO TABS: 1.0000 | ORAL_TABLET | Freq: Two times a day (BID) | ORAL | Status: DC

## 2013-10-20 NOTE — Progress Notes (Signed)
Subjective: Patient reports continued improvement. He had a good yesterday and continues to feel well. He is currently afebrile. As of this morning final sensitivity on his positive Escherichia coli urinary tract infection remain pending.  Objective: Vital signs in last 24 hours: Temp:  [97.8 F (36.6 C)-99.3 F (37.4 C)] 97.8 F (36.6 C) (11/26 0620) Pulse Rate:  [76-91] 76 (11/26 0620) Resp:  [18-19] 18 (11/26 0620) BP: (117-123)/(75-83) 117/83 mmHg (11/26 0620) SpO2:  [97 %-100 %] 100 % (11/26 0620)  Intake/Output from previous day: 11/25 0701 - 11/26 0700 In: 455.8 [P.O.:240; IV Piggyback:215.8] Out: 500 [Urine:500] Intake/Output this shift:    Physical Exam:  Constitutional: Vital signs reviewed. WD WN in NAD   Eyes: PERRL, No scleral icterus.   Cardiovascular: RRR Pulmonary/Chest: Normal effort Abdominal: Soft.  Extremities: No cyanosis or edema   Lab Results:  Recent Labs  10/18/13 0400  HGB 13.7  HCT 40.5   BMET  Recent Labs  10/18/13 0400  NA 137  K 3.4*  CL 103  CO2 25  GLUCOSE 122*  BUN 13  CREATININE 1.32  CALCIUM 8.8   No results found for this basename: LABPT, INR,  in the last 72 hours No results found for this basename: LABURIN,  in the last 72 hours Results for orders placed during the hospital encounter of 10/17/13  URINE CULTURE     Status: None   Collection Time    10/17/13  6:15 AM      Result Value Range Status   Specimen Description URINE, CLEAN CATCH   Final   Special Requests NONE   Final   Culture  Setup Time     Final   Value: 10/17/2013 17:10     Performed at [facility] Count     Final   Value: >=100,000 COLONIES/ML     Performed at [facility]   Culture     Final   Value: ESCHERICHIA COLI     Performed at [facility]   Report Status PENDING   Incomplete  CULTURE, BLOOD (ROUTINE X 2)     Status: None   Collection Time    10/17/13  8:05 AM      Result Value Range Status   Specimen Description BLOOD LEFT HAND   Final   Special Requests BOTTLES DRAWN AEROBIC AND ANAEROBIC 1.5CC EACH   Final   Culture  Setup Time     Final   Value: 10/17/2013 15:15     Performed at [facility]   Culture     Final   Value:        BLOOD CULTURE RECEIVED NO GROWTH TO DATE CULTURE WILL BE HELD FOR 5 DAYS BEFORE ISSUING A FINAL NEGATIVE REPORT     Performed at [facility]   Report Status PENDING   Incomplete  CULTURE, BLOOD (ROUTINE X 2)     Status: None   Collection Time    10/17/13  8:20 AM      Result Value Range Status   Specimen Description BLOOD RIGHT HAND   Final   Special Requests BOTTLES DRAWN AEROBIC AND ANAEROBIC .[facility] EACH   Final   Culture  Setup Time     Final   Value: 10/17/2013 15:14     Performed at [facility]   Culture     Final   Value:        BLOOD CULTURE RECEIVED NO GROWTH TO DATE CULTURE WILL  BE HELD FOR 5 DAYS BEFORE ISSUING A FINAL NEGATIVE REPORT     Performed at [facility]   Report Status PENDING   Incomplete    Studies/Results: No results found.  Assessment/Plan:   Blood cultures continue to be negative. The patient's urine culture is positive for Escherichia coli. Again this is likely to the floor: Resistant. We need to await his final sensitivities so that we can convert him to appropriate oral antibiotics or arrange for home health parental antibiotics via PICC line. We will contact the lab to find out when sensitivities will be available and are planning for hopeful discharge sometime today.   LOS: 3 days   Mehlani Blankenburg S 10/20/2013, 7:57 AM

## 2013-10-20 NOTE — Progress Notes (Signed)
Spoke with [facility] to find out sensitivities to urine culture for MD.  Resulted ESBL.  MD paged with findings.  New order for CM for home health needs.

## 2013-10-20 NOTE — Discharge Summary (Signed)
Patient ID: Fred [name] MRN: [id number] DOB/AGE: 1961/08/17 52 y.o.  Admit date: 10/17/2013 Discharge date: 10/20/2013    Discharge Diagnoses:   Present on Admission:  **None**  Consults:  None    Discharge Medications:   Medication List    STOP taking these medications       levofloxacin 500 MG tablet  Commonly known as:  LEVAQUIN      TAKE these medications       atorvastatin 40 MG tablet  Commonly known as:  LIPITOR  Take 40 mg by mouth daily.     buPROPion 300 MG 24 hr tablet  Commonly known as:  WELLBUTRIN XL  Take 300 mg by mouth daily.     ibuprofen 200 MG tablet  Commonly known as:  ADVIL,MOTRIN  Take 400 mg by mouth every 6 (six) hours as needed.     loratadine 10 MG tablet  Commonly known as:  CLARITIN  Take 10 mg by mouth daily.     mirtazapine 45 MG tablet  Commonly known as:  REMERON  Take 45 mg by mouth at bedtime.     PROCTOZONE-HC 2.5 % rectal cream  Generic drug:  hydrocortisone  Place 1 application rectally 2 (two) times daily as needed for hemorrhoids or itching.     ROCEPHIN 500 MG injection  Generic drug:  cefTRIAXone  Inject 500 mg into the muscle once. For IM use in large muscle mass     sulfamethoxazole-trimethoprim 800-160 MG per tablet  Commonly known as:  BACTRIM DS  Take 1 tablet by mouth 2 (two) times daily.         Significant Diagnostic Studies:  No results found.    Hospital Course:  Active Problems:   * No active hospital problems. *  patient was admitted with presumed urosepsis status post prostate biopsy. The patient had been covered with oral Levaquin as well as Rocephin. Patient began experiencing fever and chills as well as generalized fatigue and muscle aches approximately 48 hours status post his biopsy. The patient was admitted with tachycardia as well as fever. Blood cultures were obtained which we did not grow organism. Urine culture turned out to be positive for Escherichia coli which was ESBL. On  admission the patient was started on a combination of gentamicin as well as Zosyn. He did defervesce after approximately 48 hours and felt substantially improved. He is now been afebrile. He is voiding well and has no new complaints or issues. Interestingly susceptibility of his Escherichia coli demonstrated resistance to the Zosyn as well as the gentamicin along with a fluoroquinolone and the Rocephin. It is sensitive to Septra. The patient will be discharged on double strength Septra twice a day dosing x10 days.  Day of Discharge BP 117/83  Pulse 76  Temp(Src) 97.8 F (36.6 C) (Oral)  Resp 18  Ht 6\' 1"  (1.854 m)  Wt 207 lb 9.6 oz (94.167 kg)  BMI 27.40 kg/m2  SpO2 100% Well-developed well-nourished male. No acute distress.  Abdomen soft and nontender. Respiratory effort normal Extremities without edema or tenderness   No results found for this or any previous visit (from the past 24 hour(s)).

## 2013-10-23 LAB — CULTURE, BLOOD (ROUTINE X 2)
Culture: NO GROWTH
Culture: NO GROWTH

## 2013-11-08 ENCOUNTER — Other Ambulatory Visit (INDEPENDENT_AMBULATORY_CARE_PROVIDER_SITE_OTHER): Payer: BC Managed Care – PPO

## 2013-11-08 DIAGNOSIS — R7309 Other abnormal glucose: Secondary | ICD-10-CM

## 2013-11-08 DIAGNOSIS — R7302 Impaired glucose tolerance (oral): Secondary | ICD-10-CM

## 2013-11-08 DIAGNOSIS — Z0000 Encounter for general adult medical examination without abnormal findings: Secondary | ICD-10-CM

## 2013-11-08 LAB — HEMOGLOBIN A1C: Hgb A1c MFr Bld: 5.6 % (ref 4.6–6.5)

## 2013-11-08 LAB — CBC WITH DIFFERENTIAL/PLATELET
Basophils Relative: 0.5 % (ref 0.0–3.0)
Eosinophils Relative: 1.3 % (ref 0.0–5.0)
MCHC: 34.3 g/dL (ref 30.0–36.0)
MCV: 89.8 fl (ref 78.0–100.0)
Monocytes Relative: 5.9 % (ref 3.0–12.0)
Neutrophils Relative %: 53.8 % (ref 43.0–77.0)
Platelets: 249 10*3/uL (ref 150.0–400.0)
RBC: 4.5 Mil/uL (ref 4.22–5.81)
WBC: 6.3 10*3/uL (ref 4.5–10.5)

## 2013-11-08 LAB — URINALYSIS, ROUTINE W REFLEX MICROSCOPIC
Bilirubin Urine: NEGATIVE
Hgb urine dipstick: NEGATIVE
Nitrite: NEGATIVE
Total Protein, Urine: NEGATIVE
Urine Glucose: NEGATIVE
Urobilinogen, UA: 0.2 (ref 0.0–1.0)
pH: 5.5 (ref 5.0–8.0)

## 2013-11-08 LAB — HEPATIC FUNCTION PANEL
ALT: 34 U/L (ref 0–53)
AST: 24 U/L (ref 0–37)
Alkaline Phosphatase: 84 U/L (ref 39–117)
Bilirubin, Direct: 0.1 mg/dL (ref 0.0–0.3)
Total Bilirubin: 0.9 mg/dL (ref 0.3–1.2)
Total Protein: 6.4 g/dL (ref 6.0–8.3)

## 2013-11-08 LAB — TSH: TSH: 2.36 u[IU]/mL (ref 0.35–5.50)

## 2013-11-08 LAB — LIPID PANEL
Cholesterol: 169 mg/dL (ref 0–200)
Total CHOL/HDL Ratio: 4
VLDL: 29 mg/dL (ref 0.0–40.0)

## 2013-11-08 LAB — BASIC METABOLIC PANEL
BUN: 18 mg/dL (ref 6–23)
CO2: 27 mEq/L (ref 19–32)
Calcium: 9.3 mg/dL (ref 8.4–10.5)
Creatinine, Ser: 1.1 mg/dL (ref 0.4–1.5)
GFR: 73.59 mL/min (ref >60.00)
Glucose, Bld: 92 mg/dL (ref 70–99)
Potassium: 4 mEq/L (ref 3.5–5.1)

## 2013-11-12 ENCOUNTER — Encounter: Payer: Self-pay | Admitting: Internal Medicine

## 2013-11-12 ENCOUNTER — Ambulatory Visit (INDEPENDENT_AMBULATORY_CARE_PROVIDER_SITE_OTHER): Payer: BC Managed Care – PPO | Admitting: Internal Medicine

## 2013-11-12 VITALS — BP 140/90 | HR 98 | Temp 97.7°F | Ht 73.0 in | Wt 207.0 lb

## 2013-11-12 DIAGNOSIS — Z0000 Encounter for general adult medical examination without abnormal findings: Secondary | ICD-10-CM

## 2013-11-12 DIAGNOSIS — I10 Essential (primary) hypertension: Secondary | ICD-10-CM

## 2013-11-12 DIAGNOSIS — F329 Major depressive disorder, single episode, unspecified: Secondary | ICD-10-CM

## 2013-11-12 MED ORDER — BUPROPION HCL ER (XL) 300 MG PO TB24: 300.0000 mg | ORAL_TABLET | Freq: Every day | ORAL | Status: DC

## 2013-11-12 MED ORDER — ASPIRIN EC 81 MG PO TBEC: 81.0000 mg | DELAYED_RELEASE_TABLET | Freq: Every day | ORAL | Status: DC

## 2013-11-12 MED ORDER — IRBESARTAN 75 MG PO TABS: 75.0000 mg | ORAL_TABLET | Freq: Every day | ORAL | Status: DC

## 2013-11-12 MED ORDER — MIRTAZAPINE 45 MG PO TABS: 45.0000 mg | ORAL_TABLET | Freq: Every day | ORAL | Status: DC

## 2013-11-12 NOTE — Assessment & Plan Note (Signed)
Ok to refill meds

## 2013-11-12 NOTE — Progress Notes (Signed)
Subjective:    Patient ID: Fred [name], male    DOB: 1961/05/13, 52 y.o.   MRN: [id number]  HPI  Here for wellness and f/u;  Overall doing ok;  Pt denies CP, worsening SOB, DOE, wheezing, orthopnea, PND, worsening LE edema, palpitations, dizziness or syncope.  Pt denies neurological change such as new headache, facial or extremity weakness.  Pt denies polydipsia, polyuria, or low sugar symptoms. Pt states overall good compliance with treatment and medications, good tolerability, and has been trying to follow lower cholesterol diet.  Pt denies worsening depressive symptoms, suicidal ideation or panic. No fever, night sweats, wt loss, loss of appetite, or other constitutional symptoms.  Pt states good ability with ADL's, has low fall risk, home safety reviewed and adequate, no other significant changes in hearing or vision, and only occasionally active with exercise.  Had prostate biopsy nov 2014 per Dr Isabel Caprice - neg for malignancy, complicated by prostatitis with resis e coli (hospd with IV antibx); no other acute complaints Past Medical History  Diagnosis Date  . ALCOHOL ABUSE, EPISODIC, HX OF 02/15/2008  . ALLERGIC RHINITIS 02/15/2008  . DEPRESSION 07/28/2007  . HEARING LOSS 08/05/2008  . HYPERLIPIDEMIA 02/11/2008  . HYPERTENSION 07/28/2007  . PSA, INCREASED 10/01/2010  . TRANSAMINASES, SERUM, ELEVATED 09/01/2009  . Tubular adenoma of colon 10/2010   Past Surgical History  Procedure Laterality Date  . Left foot surgery  08/2007  . Inguinal herniorrhapy  1976  . Colonoscopy    . Polypectomy      reports that he has never smoked. He has never used smokeless tobacco. He reports that he does not drink alcohol or use illicit drugs. family history includes Alcohol abuse in his mother; Colon cancer in his maternal grandmother; Depression in his mother; Hypertension in his father; Prostate cancer in his father. There is no history of Rectal cancer, Stomach cancer, or Esophageal cancer. No Known  Allergies Current Outpatient Prescriptions on File Prior to Visit  Medication Sig Dispense Refill  . atorvastatin (LIPITOR) 40 MG tablet Take 40 mg by mouth daily.      Marland Kitchen loratadine (CLARITIN) 10 MG tablet Take 10 mg by mouth daily.       No current facility-administered medications on file prior to visit.     Review of Systems Constitutional: Negative for diaphoresis, activity change, appetite change or unexpected weight change.  HENT: Negative for hearing loss, ear pain, facial swelling, mouth sores and neck stiffness.   Eyes: Negative for pain, redness and visual disturbance.  Respiratory: Negative for shortness of breath and wheezing.   Cardiovascular: Negative for chest pain and palpitations.  Gastrointestinal: Negative for diarrhea, blood in stool, abdominal distention or other pain Genitourinary: Negative for hematuria, flank pain or change in urine volume.  Musculoskeletal: Negative for myalgias and joint swelling.  Skin: Negative for color change and wound.  Neurological: Negative for syncope and numbness. other than noted Hematological: Negative for adenopathy.  Psychiatric/Behavioral: Negative for hallucinations, self-injury, decreased concentration and agitation.      Objective:   Physical Exam BP 140/90  Pulse 98  Temp(Src) 97.7 F (36.5 C) (Oral)  Ht 6\' 1"  (1.854 m)  Wt 207 lb (93.895 kg)  BMI 27.32 kg/m2  SpO2 97% VS noted,  Constitutional: Pt is oriented to person, place, and time. Appears well-developed and well-nourished.  Head: Normocephalic and atraumatic.  Right Ear: External ear normal.  Left Ear: External ear normal.  Nose: Nose normal.  Mouth/Throat: Oropharynx is clear and moist.  Eyes: Conjunctivae and EOM are normal. Pupils are equal, round, and reactive to light.  Neck: Normal range of motion. Neck supple. No JVD present. No tracheal deviation present.  Cardiovascular: Normal rate, regular rhythm, normal heart sounds and intact distal pulses.     Pulmonary/Chest: Effort normal and breath sounds normal.  Abdominal: Soft. Bowel sounds are normal. There is no tenderness. No HSM  Musculoskeletal: Normal range of motion. Exhibits no edema.  Lymphadenopathy:  Has no cervical adenopathy.  Neurological: Pt is alert and oriented to person, place, and time. Pt has normal reflexes. No cranial nerve deficit.  Skin: Skin is warm and dry. No rash noted.  Psychiatric:  Has mild dypshoric mood and affect. Behavior is normal.     Assessment & Plan:

## 2013-11-12 NOTE — Patient Instructions (Addendum)
Please start aspirin 81 mg - 1 per day - Enteric coated only Please take all new medication as prescribed - the generic avapro 75 mg per day Please continue all other medications as before, and refills have been done if requested - the wellbutrin and remeron Please have the pharmacy call with any other refills you may need.  Please continue your efforts at being more active, low cholesterol diet, and weight control. You are otherwise up to date with prevention measures today.  Your lab work and ECG were OK today  Please keep your appointments with your specialists as you have planned - dr Isabel Caprice  Your labs will be faxed to Dr Isabel Caprice  Please remember to sign up for My Chart if you have not done so, as this will be important to you in the future with finding out test results, communicating by private email, and scheduling acute appointments online when needed.  Please return in 1 year for your yearly visit, or sooner if needed, with Lab testing done 3-5 days before

## 2013-11-12 NOTE — Assessment & Plan Note (Signed)
To re-start med for mild elev BP - to start avapro 75 qd

## 2013-11-12 NOTE — Progress Notes (Signed)
Pre-visit discussion using our clinic review tool. No additional management support is needed unless otherwise documented below in the visit note.  

## 2013-11-13 ENCOUNTER — Other Ambulatory Visit: Payer: Self-pay | Admitting: Internal Medicine

## 2013-11-13 NOTE — Assessment & Plan Note (Signed)

## 2014-02-08 ENCOUNTER — Other Ambulatory Visit: Payer: Self-pay | Admitting: Internal Medicine

## 2014-04-14 ENCOUNTER — Telehealth: Payer: Self-pay

## 2014-04-14 MED ORDER — MIRTAZAPINE 45 MG PO TABS: 45.0000 mg | ORAL_TABLET | Freq: Every day | ORAL | Status: DC

## 2014-04-14 MED ORDER — BUPROPION HCL ER (XL) 300 MG PO TB24: 300.0000 mg | ORAL_TABLET | Freq: Every day | ORAL | Status: DC

## 2014-04-14 NOTE — Telephone Encounter (Signed)
The patient was seeing a Psy. RN who has now retired and she would refill his remeron and wellbutrin.  The patient stated that at his December 2014 CPX PCP stated he would take over the refills of these meds. Once RN retired.  He does need refill at this time sent to [facility] [place].  Call back number [phone]

## 2014-04-14 NOTE — Telephone Encounter (Signed)
Done erx 

## 2014-04-15 NOTE — Telephone Encounter (Signed)
Patient informed. 

## 2014-09-24 ENCOUNTER — Other Ambulatory Visit: Payer: Self-pay | Admitting: Internal Medicine

## 2014-11-11 ENCOUNTER — Other Ambulatory Visit: Payer: Self-pay | Admitting: Internal Medicine

## 2014-12-06 ENCOUNTER — Other Ambulatory Visit (INDEPENDENT_AMBULATORY_CARE_PROVIDER_SITE_OTHER): Payer: Self-pay

## 2014-12-06 DIAGNOSIS — Z0000 Encounter for general adult medical examination without abnormal findings: Secondary | ICD-10-CM

## 2014-12-06 LAB — URINALYSIS, ROUTINE W REFLEX MICROSCOPIC
BILIRUBIN URINE: NEGATIVE
Hgb urine dipstick: NEGATIVE
Ketones, ur: NEGATIVE
NITRITE: NEGATIVE
RBC / HPF: NONE SEEN (ref 0–?)
Total Protein, Urine: NEGATIVE
UROBILINOGEN UA: 0.2 (ref 0.0–1.0)
Urine Glucose: NEGATIVE
pH: 5.5 (ref 5.0–8.0)

## 2014-12-06 LAB — LIPID PANEL
Cholesterol: 159 mg/dL (ref 0–200)
HDL: 37.1 mg/dL — ABNORMAL LOW (ref >39.00)
LDL Cholesterol: 96 mg/dL (ref 0–99)
NonHDL: 121.9
Total CHOL/HDL Ratio: 4
Triglycerides: 129 mg/dL (ref 0.0–149.0)
VLDL: 25.8 mg/dL (ref 0.0–40.0)

## 2014-12-06 LAB — BASIC METABOLIC PANEL
BUN: 18 mg/dL (ref 6–23)
CHLORIDE: 107 meq/L (ref 96–112)
CO2: 27 mEq/L (ref 19–32)
Calcium: 9.5 mg/dL (ref 8.4–10.5)
Creatinine, Ser: 1 mg/dL (ref 0.4–1.5)
GFR: 81.73 mL/min (ref >60.00)
Glucose, Bld: 100 mg/dL — ABNORMAL HIGH (ref 70–99)
POTASSIUM: 4.5 meq/L (ref 3.5–5.1)
SODIUM: 140 meq/L (ref 135–145)

## 2014-12-06 LAB — CBC WITH DIFFERENTIAL/PLATELET
BASOS PCT: 0.4 % (ref 0.0–3.0)
Basophils Absolute: 0 10*3/uL (ref 0.0–0.1)
Eosinophils Absolute: 0.2 10*3/uL (ref 0.0–0.7)
Eosinophils Relative: 1.8 % (ref 0.0–5.0)
HEMATOCRIT: 44.2 % (ref 39.0–52.0)
Hemoglobin: 14.7 g/dL (ref 13.0–17.0)
Lymphocytes Relative: 25.3 % (ref 12.0–46.0)
Lymphs Abs: 2.2 10*3/uL (ref 0.7–4.0)
MCHC: 33.4 g/dL (ref 30.0–36.0)
MCV: 93.2 fl (ref 78.0–100.0)
MONOS PCT: 6 % (ref 3.0–12.0)
Monocytes Absolute: 0.5 10*3/uL (ref 0.1–1.0)
Neutro Abs: 5.9 10*3/uL (ref 1.4–7.7)
Neutrophils Relative %: 66.5 % (ref 43.0–77.0)
PLATELETS: 212 10*3/uL (ref 150.0–400.0)
RBC: 4.74 Mil/uL (ref 4.22–5.81)
RDW: 13.7 % (ref 11.5–15.5)
WBC: 8.8 10*3/uL (ref 4.0–10.5)

## 2014-12-06 LAB — TSH: TSH: 2.41 u[IU]/mL (ref 0.35–4.50)

## 2014-12-06 LAB — HEPATIC FUNCTION PANEL
ALBUMIN: 4.4 g/dL (ref 3.5–5.2)
ALK PHOS: 83 U/L (ref 39–117)
ALT: 54 U/L — AB (ref 0–53)
AST: 35 U/L (ref 0–37)
Bilirubin, Direct: 0.2 mg/dL (ref 0.0–0.3)
Total Bilirubin: 1.1 mg/dL (ref 0.2–1.2)
Total Protein: 6.9 g/dL (ref 6.0–8.3)

## 2014-12-06 LAB — PSA: PSA: 10.2 ng/mL — ABNORMAL HIGH (ref 0.10–4.00)

## 2014-12-09 ENCOUNTER — Ambulatory Visit (INDEPENDENT_AMBULATORY_CARE_PROVIDER_SITE_OTHER): Payer: 59 | Admitting: Internal Medicine

## 2014-12-09 ENCOUNTER — Encounter: Payer: Self-pay | Admitting: Internal Medicine

## 2014-12-09 VITALS — BP 110/80 | HR 91 | Temp 97.9°F | Ht 73.0 in | Wt 199.1 lb

## 2014-12-09 DIAGNOSIS — Z0000 Encounter for general adult medical examination without abnormal findings: Secondary | ICD-10-CM

## 2014-12-09 DIAGNOSIS — R972 Elevated prostate specific antigen [PSA]: Secondary | ICD-10-CM

## 2014-12-09 NOTE — Progress Notes (Signed)
Subjective:    Patient ID: Fred [name], male    DOB: 1961-08-03, 54 y.o.   MRN: [id number]  HPI Here for wellness and f/u;  Overall doing ok;  Pt denies CP, worsening SOB, DOE, wheezing, orthopnea, PND, worsening LE edema, palpitations, dizziness or syncope.  Pt denies neurological change such as new headache, facial or extremity weakness.  Pt denies polydipsia, polyuria, or low sugar symptoms. Pt states overall good compliance with treatment and medications, good tolerability, and has been trying to follow lower cholesterol diet.  Pt denies worsening depressive symptoms, suicidal ideation or panic. No fever, night sweats, wt loss, loss of appetite, or other constitutional symptoms.  Pt states good ability with ADL's, has low fall risk, home safety reviewed and adequate, no other significant changes in hearing or vision, and only occasionally active with exercise. No current complaints Past Medical History  Diagnosis Date  . ALCOHOL ABUSE, EPISODIC, HX OF 02/15/2008  . ALLERGIC RHINITIS 02/15/2008  . DEPRESSION 07/28/2007  . HEARING LOSS 08/05/2008  . HYPERLIPIDEMIA 02/11/2008  . HYPERTENSION 07/28/2007  . PSA, INCREASED 10/01/2010  . TRANSAMINASES, SERUM, ELEVATED 09/01/2009  . Tubular adenoma of colon 10/2010   Past Surgical History  Procedure Laterality Date  . Left foot surgery  08/2007  . Inguinal herniorrhapy  1976  . Colonoscopy    . Polypectomy      reports that he has never smoked. He has never used smokeless tobacco. He reports that he does not drink alcohol or use illicit drugs. family history includes Alcohol abuse in his mother; Colon cancer in his maternal grandmother; Depression in his mother; Hypertension in his father; Prostate cancer in his father. There is no history of Rectal cancer, Stomach cancer, or Esophageal cancer. No Known Allergies Current Outpatient Prescriptions on File Prior to Visit  Medication Sig Dispense Refill  . aspirin EC 81 MG tablet Take 1 tablet (81  mg total) by mouth daily. 90 tablet 11  . atorvastatin (LIPITOR) 40 MG tablet Take 40 mg by mouth daily.    Marland Kitchen buPROPion (WELLBUTRIN XL) 300 MG 24 hr tablet TAKE 1 TABLET BY MOUTH EVERY DAY. 90 tablet 0  . irbesartan (AVAPRO) 75 MG tablet TAKE 1 TABLET BY MOUTH EVERY DAY 90 tablet 0  . loratadine (CLARITIN) 10 MG tablet Take 10 mg by mouth daily.    . mirtazapine (REMERON) 45 MG tablet Take 1 tablet (45 mg total) by mouth at bedtime. 90 tablet 1   No current facility-administered medications on file prior to visit.   Review of Systems Constitutional: Negative for increased diaphoresis, other activity, appetite or other siginficant weight change  HENT: Negative for worsening hearing loss, ear pain, facial swelling, mouth sores and neck stiffness.   Eyes: Negative for other worsening pain, redness or visual disturbance.  Respiratory: Negative for shortness of breath and wheezing.   Cardiovascular: Negative for chest pain and palpitations.  Gastrointestinal: Negative for diarrhea, blood in stool, abdominal distention or other pain Genitourinary: Negative for hematuria, flank pain or change in urine volume.  Musculoskeletal: Negative for myalgias or other joint complaints.  Skin: Negative for color change and wound.  Neurological: Negative for syncope and numbness. other than noted Hematological: Negative for adenopathy. or other swelling Psychiatric/Behavioral: Negative for hallucinations, self-injury, decreased concentration or other worsening agitation.      Objective:   Physical Exam BP 110/80 mmHg  Pulse 91  Temp(Src) 97.9 F (36.6 C) (Oral)  Ht 6\' 1"  (1.854 m)  Wt 199 lb  2 oz (90.323 kg)  BMI 26.28 kg/m2  SpO2 97% VS noted,  Constitutional: Pt is oriented to person, place, and time. Appears well-developed and well-nourished.  Head: Normocephalic and atraumatic.  Right Ear: External ear normal.  Left Ear: External ear normal.  Nose: Nose normal.  Mouth/Throat: Oropharynx is  clear and moist.  Eyes: Conjunctivae and EOM are normal. Pupils are equal, round, and reactive to light.  Neck: Normal range of motion. Neck supple. No JVD present. No tracheal deviation present.  Cardiovascular: Normal rate, regular rhythm, normal heart sounds and intact distal pulses.   Pulmonary/Chest: Effort normal and breath sounds without rales or wheezing  Abdominal: Soft. Bowel sounds are normal. NT. No HSM  Musculoskeletal: Normal range of motion. Exhibits no edema.  Lymphadenopathy:  Has no cervical adenopathy.  Neurological: Pt is alert and oriented to person, place, and time. Pt has normal reflexes. No cranial nerve deficit. Motor grossly intact Skin: Skin is warm and dry. No rash noted.  Psychiatric:  Has normal mood and affect. Behavior is normal.     Assessment & Plan:

## 2014-12-09 NOTE — Assessment & Plan Note (Signed)

## 2014-12-09 NOTE — Patient Instructions (Addendum)
Please continue all other medications as before, and refills have been done if requested.  Please have the pharmacy call with any other refills you may need.  Please continue your efforts at being more active, low cholesterol diet, and weight control.  You are otherwise up to date with prevention measures today.  Please keep your appointments with your specialists as you may have planned  You will be contacted regarding the referral for: urology  We will hold off on further PSA done here (unless you want this) so as not to confuse the elevated PSA issue in future with Dr Cy Blamer lab  Please return in 1 year for your yearly visit, or sooner if needed, with Lab testing done 3-5 days before

## 2014-12-09 NOTE — Progress Notes (Signed)
Pre visit review using our clinic review tool, if applicable. No additional management support is needed unless otherwise documented below in the visit note. 

## 2014-12-10 NOTE — Assessment & Plan Note (Signed)
For urology referral 

## 2014-12-15 ENCOUNTER — Other Ambulatory Visit: Payer: Self-pay | Admitting: Internal Medicine

## 2014-12-21 ENCOUNTER — Other Ambulatory Visit: Payer: Self-pay | Admitting: Internal Medicine

## 2014-12-28 ENCOUNTER — Other Ambulatory Visit (HOSPITAL_COMMUNITY): Payer: Self-pay | Admitting: Urology

## 2014-12-28 DIAGNOSIS — R972 Elevated prostate specific antigen [PSA]: Secondary | ICD-10-CM

## 2014-12-30 ENCOUNTER — Other Ambulatory Visit: Payer: Self-pay

## 2014-12-30 MED ORDER — ASPIRIN EC 81 MG PO TBEC: 81.0000 mg | DELAYED_RELEASE_TABLET | Freq: Every day | ORAL | Status: DC

## 2015-01-16 ENCOUNTER — Ambulatory Visit (HOSPITAL_COMMUNITY)
Admission: RE | Admit: 2015-01-16 | Discharge: 2015-01-16 | Disposition: A | Discharge status: Home / Self Care | Payer: 59 | Source: Ambulatory Visit | Attending: Urology | Admitting: Urology

## 2015-01-16 DIAGNOSIS — R972 Elevated prostate specific antigen [PSA]: Secondary | ICD-10-CM

## 2015-01-16 DIAGNOSIS — N400 Enlarged prostate without lower urinary tract symptoms: Secondary | ICD-10-CM | POA: Insufficient documentation

## 2015-01-16 LAB — CREATININE, SERUM
CREATININE: 1.09 mg/dL (ref 0.50–1.35)
GFR calc non Af Amer: 75 mL/min — ABNORMAL LOW (ref >90)
GFR, EST AFRICAN AMERICAN: 87 mL/min — AB (ref >90)

## 2015-01-30 ENCOUNTER — Other Ambulatory Visit: Payer: Self-pay | Admitting: Dermatology

## 2015-03-08 ENCOUNTER — Other Ambulatory Visit: Payer: Self-pay | Admitting: *Deleted

## 2015-03-08 MED ORDER — BUPROPION HCL ER (XL) 300 MG PO TB24: 300.0000 mg | ORAL_TABLET | Freq: Every day | ORAL | Status: DC

## 2015-03-08 MED ORDER — ATORVASTATIN CALCIUM 40 MG PO TABS: 40.0000 mg | ORAL_TABLET | Freq: Every day | ORAL | Status: DC

## 2015-08-28 ENCOUNTER — Other Ambulatory Visit: Payer: Self-pay | Admitting: Internal Medicine

## 2015-11-16 ENCOUNTER — Encounter: Payer: Self-pay | Admitting: Gastroenterology

## 2015-11-16 ENCOUNTER — Other Ambulatory Visit: Payer: Self-pay | Admitting: Internal Medicine

## 2015-11-16 NOTE — Telephone Encounter (Signed)
remeron done erx  Dahlia to let pt know - due for ROV jan 2017

## 2015-12-12 ENCOUNTER — Other Ambulatory Visit (INDEPENDENT_AMBULATORY_CARE_PROVIDER_SITE_OTHER): Payer: Self-pay

## 2015-12-12 DIAGNOSIS — Z0000 Encounter for general adult medical examination without abnormal findings: Secondary | ICD-10-CM

## 2015-12-12 LAB — BASIC METABOLIC PANEL
BUN: 17 mg/dL (ref 6–23)
CO2: 29 meq/L (ref 19–32)
Calcium: 9.5 mg/dL (ref 8.4–10.5)
Chloride: 106 mEq/L (ref 96–112)
Creatinine, Ser: 1.05 mg/dL (ref 0.40–1.50)
GFR: 77.86 mL/min (ref >60.00)
GLUCOSE: 110 mg/dL — AB (ref 70–99)
Potassium: 3.9 mEq/L (ref 3.5–5.1)
Sodium: 143 mEq/L (ref 135–145)

## 2015-12-12 LAB — URINALYSIS, ROUTINE W REFLEX MICROSCOPIC
Bilirubin Urine: NEGATIVE
HGB URINE DIPSTICK: NEGATIVE
Ketones, ur: NEGATIVE
LEUKOCYTES UA: NEGATIVE
NITRITE: NEGATIVE
RBC / HPF: NONE SEEN (ref 0–?)
SPECIFIC GRAVITY, URINE: 1.02 (ref 1.000–1.030)
Total Protein, Urine: NEGATIVE
URINE GLUCOSE: NEGATIVE
Urobilinogen, UA: 0.2 (ref 0.0–1.0)
WBC UA: NONE SEEN (ref 0–?)
pH: 6 (ref 5.0–8.0)

## 2015-12-12 LAB — HEPATIC FUNCTION PANEL
ALBUMIN: 4 g/dL (ref 3.5–5.2)
ALT: 28 U/L (ref 0–53)
AST: 19 U/L (ref 0–37)
Alkaline Phosphatase: 68 U/L (ref 39–117)
Bilirubin, Direct: 0.1 mg/dL (ref 0.0–0.3)
Total Bilirubin: 0.4 mg/dL (ref 0.2–1.2)
Total Protein: 6.8 g/dL (ref 6.0–8.3)

## 2015-12-12 LAB — CBC WITH DIFFERENTIAL/PLATELET
BASOS PCT: 0.4 % (ref 0.0–3.0)
Basophils Absolute: 0 10*3/uL (ref 0.0–0.1)
EOS PCT: 3.2 % (ref 0.0–5.0)
Eosinophils Absolute: 0.3 10*3/uL (ref 0.0–0.7)
HEMATOCRIT: 44.7 % (ref 39.0–52.0)
HEMOGLOBIN: 15.1 g/dL (ref 13.0–17.0)
LYMPHS PCT: 34.2 % (ref 12.0–46.0)
Lymphs Abs: 2.8 10*3/uL (ref 0.7–4.0)
MCHC: 33.7 g/dL (ref 30.0–36.0)
MCV: 93.3 fl (ref 78.0–100.0)
MONO ABS: 0.5 10*3/uL (ref 0.1–1.0)
Monocytes Relative: 6.7 % (ref 3.0–12.0)
Neutro Abs: 4.5 10*3/uL (ref 1.4–7.7)
Neutrophils Relative %: 55.5 % (ref 43.0–77.0)
Platelets: 288 10*3/uL (ref 150.0–400.0)
RBC: 4.79 Mil/uL (ref 4.22–5.81)
RDW: 13.9 % (ref 11.5–15.5)
WBC: 8.2 10*3/uL (ref 4.0–10.5)

## 2015-12-12 LAB — TSH: TSH: 2.63 u[IU]/mL (ref 0.35–4.50)

## 2015-12-12 LAB — LIPID PANEL
Cholesterol: 172 mg/dL (ref 0–200)
HDL: 37.8 mg/dL — ABNORMAL LOW (ref >39.00)
LDL Cholesterol: 106 mg/dL — ABNORMAL HIGH (ref 0–99)
NonHDL: 134.61
Total CHOL/HDL Ratio: 5
Triglycerides: 144 mg/dL (ref 0.0–149.0)
VLDL: 28.8 mg/dL (ref 0.0–40.0)

## 2015-12-13 ENCOUNTER — Other Ambulatory Visit: Payer: Self-pay | Admitting: Internal Medicine

## 2015-12-14 ENCOUNTER — Encounter: Payer: Self-pay | Admitting: Internal Medicine

## 2015-12-14 ENCOUNTER — Ambulatory Visit (INDEPENDENT_AMBULATORY_CARE_PROVIDER_SITE_OTHER): Payer: BLUE CROSS/BLUE SHIELD | Admitting: Internal Medicine

## 2015-12-14 VITALS — BP 146/96 | HR 95 | Temp 97.9°F | Ht 73.0 in | Wt 179.0 lb

## 2015-12-14 DIAGNOSIS — R972 Elevated prostate specific antigen [PSA]: Secondary | ICD-10-CM | POA: Diagnosis not present

## 2015-12-14 DIAGNOSIS — I10 Essential (primary) hypertension: Secondary | ICD-10-CM

## 2015-12-14 DIAGNOSIS — R7302 Impaired glucose tolerance (oral): Secondary | ICD-10-CM

## 2015-12-14 DIAGNOSIS — H6123 Impacted cerumen, bilateral: Secondary | ICD-10-CM

## 2015-12-14 DIAGNOSIS — J019 Acute sinusitis, unspecified: Secondary | ICD-10-CM | POA: Insufficient documentation

## 2015-12-14 DIAGNOSIS — F329 Major depressive disorder, single episode, unspecified: Secondary | ICD-10-CM

## 2015-12-14 DIAGNOSIS — F32A Depression, unspecified: Secondary | ICD-10-CM

## 2015-12-14 DIAGNOSIS — Z0000 Encounter for general adult medical examination without abnormal findings: Secondary | ICD-10-CM | POA: Diagnosis not present

## 2015-12-14 DIAGNOSIS — H6120 Impacted cerumen, unspecified ear: Secondary | ICD-10-CM | POA: Insufficient documentation

## 2015-12-14 MED ORDER — BUPROPION HCL ER (XL) 300 MG PO TB24: 300.0000 mg | ORAL_TABLET | Freq: Every day | ORAL | Status: DC

## 2015-12-14 MED ORDER — ATORVASTATIN CALCIUM 40 MG PO TABS: 40.0000 mg | ORAL_TABLET | Freq: Every day | ORAL | Status: DC

## 2015-12-14 MED ORDER — LEVOFLOXACIN 500 MG PO TABS: 500.0000 mg | ORAL_TABLET | Freq: Every day | ORAL | Status: DC

## 2015-12-14 MED ORDER — IRBESARTAN 75 MG PO TABS: 75.0000 mg | ORAL_TABLET | Freq: Every day | ORAL | Status: DC

## 2015-12-14 NOTE — Patient Instructions (Addendum)
Your ears were irrigated today  Please take all new medication as prescribe - the levaquin  Please return to the LAB only after the levaquin for the f/u PSA  Your EKG and Labs were OK today  Please continue all other medications as before, and refills have been done if requested.  Please have the pharmacy call with any other refills you may need.  Please continue your efforts at being more active, low cholesterol diet, and weight control.  You will be contacted regarding the referral for: cologuard  You are otherwise up to date with prevention measures today.  Please keep your appointments with your specialists as you may have planned  Please return in 1 year for your yearly visit, or sooner if needed, with Lab testing done 3-5 days before

## 2015-12-14 NOTE — Assessment & Plan Note (Signed)
Mild elev today likely situational, o.w stable overall by history and exam, recent data reviewed with pt, and pt to continue medical treatment as before,  to f/u any worsening symptoms or concerns BP Readings from Last 3 Encounters:  12/14/15 146/96  12/09/14 110/80  11/12/13 140/90

## 2015-12-14 NOTE — Progress Notes (Signed)
Pre visit review using our clinic review tool, if applicable. No additional management support is needed unless otherwise documented below in the visit note. 

## 2015-12-14 NOTE — Assessment & Plan Note (Signed)
stable overall by history and exam, recent data reviewed with pt, and pt to continue medical treatment as before,  to f/u any worsening symptoms or concerns Lab Results  Component Value Date   WBC 8.2 12/12/2015   HGB 15.1 12/12/2015   HCT 44.7 12/12/2015   PLT 288.0 12/12/2015   GLUCOSE 110* 12/12/2015   CHOL 172 12/12/2015   TRIG 144.0 12/12/2015   HDL 37.80* 12/12/2015   LDLDIRECT 163.2 11/03/2012   LDLCALC 106* 12/12/2015   ALT 28 12/12/2015   AST 19 12/12/2015   NA 143 12/12/2015   K 3.9 12/12/2015   CL 106 12/12/2015   CREATININE 1.05 12/12/2015   BUN 17 12/12/2015   CO2 29 12/12/2015   TSH 2.63 12/12/2015   PSA 10.20* 12/06/2014   HGBA1C 5.6 11/08/2013

## 2015-12-14 NOTE — Assessment & Plan Note (Addendum)

## 2015-12-14 NOTE — Addendum Note (Signed)
Addended by: Biagio Borg on: 12/14/2015 12:57 PM   Modules accepted: Miquel Dunn

## 2015-12-14 NOTE — Assessment & Plan Note (Signed)
Improved with irrigation,  to f/u any worsening symptoms or concerns  

## 2015-12-14 NOTE — Progress Notes (Signed)
Subjective:    Patient ID: Fred [name], male    DOB: [birth year], 54 y.o.   MRN: [id number]  HPI  Here for wellness and f/u;  Overall doing ok;  Pt denies Chest pain, worsening SOB, DOE, wheezing, orthopnea, PND, worsening LE edema, palpitations, dizziness or syncope.  Pt denies neurological change such as new headache, facial or extremity weakness.  Pt denies polydipsia, polyuria, or low sugar symptoms. Pt states overall good compliance with treatment and medications, good tolerability, and has been trying to follow appropriate diet.  Pt denies worsening depressive symptoms, suicidal ideation or panic. No fever, night sweats, wt loss, loss of appetite, or other constitutional symptoms.  Pt states good ability with ADL's, has low fall risk, home safety reviewed and adequate, no other significant changes in hearing or vision, and only occasionally active with exercise. Declines colonsocopy in favor of cologuard.  Lost wt intenitonally with reduced red meats per wt, more veggies.  Mother died lung cancer yetserday, father now on dialysis.  So not very active, but planning to start now.  Sees Dr Risa Grill with regular psa alsways mild elevated, with neg biopsy x 2, and MRI neg.  Wt Readings from Last 3 Encounters:  12/14/15 179 lb (81.194 kg)  12/09/14 199 lb 2 oz (90.323 kg)  11/12/13 207 lb (93.895 kg)  incidentally today -  Here with 2-3 days acute onset fever, facial pain, pressure, headache, general weakness and malaise, and greenish d/c, with mild ST and cough Past Medical History  Diagnosis Date  . ALCOHOL ABUSE, EPISODIC, HX OF 02/15/2008  . ALLERGIC RHINITIS 02/15/2008  . DEPRESSION 07/28/2007  . HEARING LOSS 08/05/2008  . HYPERLIPIDEMIA 02/11/2008  . HYPERTENSION 07/28/2007  . PSA, INCREASED 10/01/2010  . TRANSAMINASES, SERUM, ELEVATED 09/01/2009  . Tubular adenoma of colon 10/2010   Past Surgical History  Procedure Laterality Date  . Left foot surgery  08/2007  . Inguinal herniorrhapy  1976    . Colonoscopy    . Polypectomy      reports that he has never smoked. He has never used smokeless tobacco. He reports that he does not drink alcohol or use illicit drugs. family history includes Alcohol abuse in his mother; Colon cancer in his maternal grandmother; Depression in his mother; Hypertension in his father; Prostate cancer in his father. There is no history of Rectal cancer, Stomach cancer, or Esophageal cancer. No Known Allergies' Current Outpatient Prescriptions on File Prior to Visit  Medication Sig Dispense Refill  . aspirin EC 81 MG tablet Take 1 tablet (81 mg total) by mouth daily. 90 tablet 11  . loratadine (CLARITIN) 10 MG tablet Take 10 mg by mouth daily.    . mirtazapine (REMERON) 45 MG tablet TAKE 1 TABLET BY MOUTH EVERY NIGHT AT BEDTIME 90 tablet 3   No current facility-administered medications on file prior to visit.   ,Review of Systems Constitutional: Negative for increased diaphoresis, other activity, appetite or siginficant weight change other than noted HENT: Negative for worsening hearing loss, ear pain, facial swelling, mouth sores and neck stiffness.   Eyes: Negative for other worsening pain, redness or visual disturbance.  Respiratory: Negative for shortness of breath and wheezing  Cardiovascular: Negative for chest pain and palpitations.  Gastrointestinal: Negative for diarrhea, blood in stool, abdominal distention or other pain Genitourinary: Negative for hematuria, flank pain or change in urine volume.  Musculoskeletal: Negative for myalgias or other joint complaints.  Skin: Negative for color change and wound or drainage.  Neurological:  Negative for syncope and numbness. other than noted Hematological: Negative for adenopathy. or other swelling Psychiatric/Behavioral: Negative for hallucinations, SI, self-injury, decreased concentration or other worsening agitation.      Objective:   Physical Exam BP 146/96 mmHg  Pulse 95  Temp(Src) 97.9 F  (36.6 C) (Oral)  Ht 6\' 1"  (1.854 m)  Wt 179 lb (81.194 kg)  BMI 23.62 kg/m2  SpO2 98% VS noted,  Constitutional: Pt is oriented to person, place, and time. Appears well-developed and well-nourished, in no significant distress Head: Normocephalic and atraumatic.  Right Ear: External ear normal.  Left Ear: External ear normal.  Nose: Nose normal.  Mouth/Throat: Oropharynx is clear and moist.  Bilat tm's with mild erythema.  Max sinus areas mild tender.  Pharynx with mild erythema, no exudate Eyes: Conjunctivae and EOM are normal. Pupils are equal, round, and reactive to light.  Neck: Normal range of motion. Neck supple. No JVD present. No tracheal deviation present or significant neck LA or mass Cardiovascular: Normal rate, regular rhythm, normal heart sounds and intact distal pulses.   Pulmonary/Chest: Effort normal and breath sounds without rales or wheezing  Abdominal: Soft. Bowel sounds are normal. NT. No HSM  Musculoskeletal: Normal range of motion. Exhibits no edema.  Lymphadenopathy:  Has no cervical adenopathy.  Neurological: Pt is alert and oriented to person, place, and time. Pt has normal reflexes. No cranial nerve deficit. Motor grossly intact Skin: Skin is warm and dry. No rash noted.  Psychiatric:  Has normal mood and affect. Behavior is normal.     Assessment & Plan:

## 2015-12-14 NOTE — Assessment & Plan Note (Signed)
stable overall by history and exam, recent data reviewed with pt, and pt to continue medical treatment as before,  to f/u any worsening symptoms or concerns Lab Results  Component Value Date   HGBA1C 5.6 11/08/2013   For f/u a1c next visit

## 2015-12-28 LAB — COLOGUARD: Cologuard: NEGATIVE

## 2016-01-09 LAB — COLOGUARD: COLOGUARD: NEGATIVE

## 2016-01-10 ENCOUNTER — Other Ambulatory Visit: Payer: Self-pay | Admitting: Internal Medicine

## 2016-01-26 ENCOUNTER — Encounter: Payer: Self-pay | Admitting: Internal Medicine

## 2016-03-04 ENCOUNTER — Other Ambulatory Visit: Payer: Self-pay | Admitting: Internal Medicine

## 2016-03-20 DIAGNOSIS — Z85828 Personal history of other malignant neoplasm of skin: Secondary | ICD-10-CM | POA: Diagnosis not present

## 2016-03-20 DIAGNOSIS — D225 Melanocytic nevi of trunk: Secondary | ICD-10-CM | POA: Diagnosis not present

## 2016-03-20 DIAGNOSIS — D2262 Melanocytic nevi of left upper limb, including shoulder: Secondary | ICD-10-CM | POA: Diagnosis not present

## 2016-04-04 ENCOUNTER — Encounter: Payer: Self-pay | Admitting: Internal Medicine

## 2016-07-26 ENCOUNTER — Other Ambulatory Visit: Payer: Self-pay | Admitting: Internal Medicine

## 2016-08-07 DIAGNOSIS — L821 Other seborrheic keratosis: Secondary | ICD-10-CM | POA: Diagnosis not present

## 2016-08-07 DIAGNOSIS — Z85828 Personal history of other malignant neoplasm of skin: Secondary | ICD-10-CM | POA: Diagnosis not present

## 2016-08-07 DIAGNOSIS — L570 Actinic keratosis: Secondary | ICD-10-CM | POA: Diagnosis not present

## 2016-09-24 DIAGNOSIS — M62830 Muscle spasm of back: Secondary | ICD-10-CM | POA: Diagnosis not present

## 2016-12-16 ENCOUNTER — Other Ambulatory Visit (INDEPENDENT_AMBULATORY_CARE_PROVIDER_SITE_OTHER): Payer: BLUE CROSS/BLUE SHIELD

## 2016-12-16 DIAGNOSIS — Z0000 Encounter for general adult medical examination without abnormal findings: Secondary | ICD-10-CM

## 2016-12-16 DIAGNOSIS — R7302 Impaired glucose tolerance (oral): Secondary | ICD-10-CM | POA: Diagnosis not present

## 2016-12-16 LAB — HEPATIC FUNCTION PANEL
ALBUMIN: 3.9 g/dL (ref 3.5–5.2)
ALT: 73 U/L — AB (ref 0–53)
AST: 42 U/L — ABNORMAL HIGH (ref 0–37)
Alkaline Phosphatase: 51 U/L (ref 39–117)
Bilirubin, Direct: 0.1 mg/dL (ref 0.0–0.3)
TOTAL PROTEIN: 6.2 g/dL (ref 6.0–8.3)
Total Bilirubin: 0.4 mg/dL (ref 0.2–1.2)

## 2016-12-16 LAB — CBC WITH DIFFERENTIAL/PLATELET
BASOS ABS: 0.1 10*3/uL (ref 0.0–0.1)
Basophils Relative: 0.8 % (ref 0.0–3.0)
Eosinophils Absolute: 0.1 10*3/uL (ref 0.0–0.7)
Eosinophils Relative: 1.2 % (ref 0.0–5.0)
HEMATOCRIT: 40.9 % (ref 39.0–52.0)
HEMOGLOBIN: 14.1 g/dL (ref 13.0–17.0)
LYMPHS PCT: 38.3 % (ref 12.0–46.0)
Lymphs Abs: 3 10*3/uL (ref 0.7–4.0)
MCHC: 34.4 g/dL (ref 30.0–36.0)
MCV: 91.2 fl (ref 78.0–100.0)
Monocytes Absolute: 0.7 10*3/uL (ref 0.1–1.0)
Monocytes Relative: 8.9 % (ref 3.0–12.0)
Neutro Abs: 3.9 10*3/uL (ref 1.4–7.7)
Neutrophils Relative %: 50.8 % (ref 43.0–77.0)
PLATELETS: 327 10*3/uL (ref 150.0–400.0)
RBC: 4.49 Mil/uL (ref 4.22–5.81)
RDW: 13.1 % (ref 11.5–15.5)
WBC: 7.8 10*3/uL (ref 4.0–10.5)

## 2016-12-16 LAB — BASIC METABOLIC PANEL
BUN: 11 mg/dL (ref 6–23)
CHLORIDE: 104 meq/L (ref 96–112)
CO2: 31 mEq/L (ref 19–32)
CREATININE: 0.95 mg/dL (ref 0.40–1.50)
Calcium: 9.2 mg/dL (ref 8.4–10.5)
GFR: 87.07 mL/min (ref >60.00)
Glucose, Bld: 107 mg/dL — ABNORMAL HIGH (ref 70–99)
Potassium: 3.8 mEq/L (ref 3.5–5.1)
Sodium: 142 mEq/L (ref 135–145)

## 2016-12-16 LAB — URINALYSIS, ROUTINE W REFLEX MICROSCOPIC
BILIRUBIN URINE: NEGATIVE
HGB URINE DIPSTICK: NEGATIVE
KETONES UR: NEGATIVE
LEUKOCYTES UA: NEGATIVE
Nitrite: NEGATIVE
PH: 6.5 (ref 5.0–8.0)
RBC / HPF: NONE SEEN (ref 0–?)
Specific Gravity, Urine: <=1.005 — AB (ref 1.000–1.030)
Total Protein, Urine: NEGATIVE
UROBILINOGEN UA: 0.2 (ref 0.0–1.0)
Urine Glucose: NEGATIVE
WBC UA: NONE SEEN (ref 0–?)

## 2016-12-16 LAB — HEMOGLOBIN A1C: Hgb A1c MFr Bld: 5.3 % (ref 4.6–6.5)

## 2016-12-16 LAB — LIPID PANEL
Cholesterol: 109 mg/dL (ref 0–200)
HDL: 30.3 mg/dL — AB (ref >39.00)
LDL Cholesterol: 51 mg/dL (ref 0–99)
NonHDL: 78.72
Total CHOL/HDL Ratio: 4
Triglycerides: 137 mg/dL (ref 0.0–149.0)
VLDL: 27.4 mg/dL (ref 0.0–40.0)

## 2016-12-16 LAB — TSH: TSH: 2.14 u[IU]/mL (ref 0.35–4.50)

## 2016-12-19 ENCOUNTER — Other Ambulatory Visit (INDEPENDENT_AMBULATORY_CARE_PROVIDER_SITE_OTHER): Payer: BLUE CROSS/BLUE SHIELD

## 2016-12-19 ENCOUNTER — Ambulatory Visit (INDEPENDENT_AMBULATORY_CARE_PROVIDER_SITE_OTHER): Payer: BLUE CROSS/BLUE SHIELD | Admitting: Internal Medicine

## 2016-12-19 VITALS — BP 146/98 | HR 99 | Temp 98.5°F | Resp 16 | Ht 73.0 in | Wt 185.0 lb

## 2016-12-19 DIAGNOSIS — R972 Elevated prostate specific antigen [PSA]: Secondary | ICD-10-CM

## 2016-12-19 DIAGNOSIS — Z0001 Encounter for general adult medical examination with abnormal findings: Secondary | ICD-10-CM

## 2016-12-19 DIAGNOSIS — R7302 Impaired glucose tolerance (oral): Secondary | ICD-10-CM | POA: Diagnosis not present

## 2016-12-19 DIAGNOSIS — I10 Essential (primary) hypertension: Secondary | ICD-10-CM

## 2016-12-19 DIAGNOSIS — R945 Abnormal results of liver function studies: Secondary | ICD-10-CM

## 2016-12-19 DIAGNOSIS — R7989 Other specified abnormal findings of blood chemistry: Secondary | ICD-10-CM | POA: Diagnosis not present

## 2016-12-19 LAB — HEPATIC FUNCTION PANEL
ALBUMIN: 4.4 g/dL (ref 3.5–5.2)
ALK PHOS: 58 U/L (ref 39–117)
ALT: 65 U/L — ABNORMAL HIGH (ref 0–53)
AST: 28 U/L (ref 0–37)
Bilirubin, Direct: 0.1 mg/dL (ref 0.0–0.3)
Total Bilirubin: 0.6 mg/dL (ref 0.2–1.2)
Total Protein: 6.8 g/dL (ref 6.0–8.3)

## 2016-12-19 LAB — PSA: PSA: 11.04 ng/mL — AB (ref 0.10–4.00)

## 2016-12-19 LAB — HEPATITIS PANEL, ACUTE
HCV AB: NEGATIVE
HEP A IGM: NONREACTIVE
HEP B C IGM: NONREACTIVE
HEP B S AG: NEGATIVE

## 2016-12-19 MED ORDER — BUPROPION HCL ER (XL) 300 MG PO TB24: 300.0000 mg | ORAL_TABLET | Freq: Every day | ORAL | 3 refills | Status: DC

## 2016-12-19 MED ORDER — MIRTAZAPINE 45 MG PO TABS: 45.0000 mg | ORAL_TABLET | Freq: Every day | ORAL | 3 refills | Status: DC

## 2016-12-19 MED ORDER — ATORVASTATIN CALCIUM 40 MG PO TABS: 40.0000 mg | ORAL_TABLET | Freq: Every day | ORAL | 3 refills | Status: DC

## 2016-12-19 MED ORDER — IRBESARTAN 150 MG PO TABS: 150.0000 mg | ORAL_TABLET | Freq: Every day | ORAL | 3 refills | Status: DC

## 2016-12-19 NOTE — Patient Instructions (Signed)
OK to increase the avapro to 150 mg per day  Please call in 2 wks if your blood pressure is not running less than 140/90  Please continue all other medications as before, and refills have been done if requested.  Please have the pharmacy call with any other refills you may need.  Please continue your efforts at being more active, low cholesterol diet, and weight control.  You are otherwise up to date with prevention measures today.  Please keep your appointments with your specialists as you may have planned  Please go to the LAB in the Basement (turn left off the elevator) for the tests to be done today - the PSA, liver tests and Hepatitis profile  You will be contacted by phone if any changes need to be made immediately.  Otherwise, you will receive a letter about your results with an explanation, but please check with MyChart first.  Please remember to sign up for MyChart if you have not done so, as this will be important to you in the future with finding out test results, communicating by private email, and scheduling acute appointments online when needed.  Please return in 1 year for your yearly visit, or sooner if needed, with Lab testing done 3-5 days before

## 2016-12-19 NOTE — Progress Notes (Signed)
Pre visit review using our clinic review tool, if applicable. No additional management support is needed unless otherwise documented below in the visit note. 

## 2016-12-19 NOTE — Progress Notes (Signed)
Subjective:    Patient ID: Fred [name], male    DOB: [birth year], 56 y.o.   MRN: [id number]  HPI  Here for wellness and f/u;  Overall doing ok;  Pt denies Chest pain, worsening SOB, DOE, wheezing, orthopnea, PND, worsening LE edema, palpitations, dizziness or syncope.  Pt denies neurological change such as new headache, facial or extremity weakness.  Pt denies polydipsia, polyuria, or low sugar symptoms. Pt states overall good compliance with treatment and medications, good tolerability, and has been trying to follow appropriate diet.  Pt denies worsening depressive symptoms, suicidal ideation or panic. No fever, night sweats, wt loss, loss of appetite, or other constitutional symptoms.  Pt states good ability with ADL's, has low fall risk, home safety reviewed and adequate, no other significant changes in hearing or vision, and occasionally active with exercise.    BP at home very often 160/110  Has not seen urology recently.  Denies worsening reflux, abd pain, dysphagia, n/v, bowel change or blood. Denies urinary symptoms such as dysuria, frequency, urgency, flank pain, hematuria or n/v, fever, chills.  No other new history Past Medical History:  Diagnosis Date  . ALCOHOL ABUSE, EPISODIC, HX OF 02/15/2008  . ALLERGIC RHINITIS 02/15/2008  . DEPRESSION 07/28/2007  . HEARING LOSS 08/05/2008  . HYPERLIPIDEMIA 02/11/2008  . HYPERTENSION 07/28/2007  . PSA, INCREASED 10/01/2010  . TRANSAMINASES, SERUM, ELEVATED 09/01/2009  . Tubular adenoma of colon 10/2010   Past Surgical History:  Procedure Laterality Date  . COLONOSCOPY    . inguinal herniorrhapy  1976  . left foot surgery  08/2007  . POLYPECTOMY      reports that he has never smoked. He has never used smokeless tobacco. He reports that he does not drink alcohol or use drugs. family history includes Alcohol abuse in his mother; Colon cancer in his maternal grandmother; Depression in his mother; Hypertension in his father; Prostate cancer in his  father. No Known Allergies Current Outpatient Prescriptions on File Prior to Visit  Medication Sig Dispense Refill  . aspirin EC 81 MG tablet Take 1 tablet (81 mg total) by mouth daily. 90 tablet 11  . loratadine (CLARITIN) 10 MG tablet Take 10 mg by mouth daily.     No current facility-administered medications on file prior to visit.    Review of Systems Constitutional: Negative for increased diaphoresis, or other activity, appetite or siginficant weight change other than noted HENT: Negative for worsening hearing loss, ear pain, facial swelling, mouth sores and neck stiffness.   Eyes: Negative for other worsening pain, redness or visual disturbance.  Respiratory: Negative for choking or stridor Cardiovascular: Negative for other chest pain and palpitations.  Gastrointestinal: Negative for worsening diarrhea, blood in stool, or abdominal distention Genitourinary: Negative for hematuria, flank pain or change in urine volume.  Musculoskeletal: Negative for myalgias or other joint complaints.  Skin: Negative for other color change and wound or drainage.  Neurological: Negative for syncope and numbness. other than noted Hematological: Negative for adenopathy. or other swelling Psychiatric/Behavioral: Negative for hallucinations, SI, self-injury, decreased concentration or other worsening agitation.  All other system neg per pt    Objective:   Physical Exam BP (!) 146/98   Pulse 99   Temp 98.5 F (36.9 C) (Oral)   Resp 16   Ht 6\' 1"  (1.854 m)   Wt 185 lb (83.9 kg)   SpO2 98%   BMI 24.41 kg/m  VS noted,  Constitutional: Pt is oriented to person, place, and time. Appears  well-developed and well-nourished, in no significant distress Head: Normocephalic and atraumatic  Eyes: Conjunctivae and EOM are normal. Pupils are equal, round, and reactive to light Right Ear: External ear normal.  Left Ear: External ear normal Nose: Nose normal.  Mouth/Throat: Oropharynx is clear and moist    Neck: Normal range of motion. Neck supple. No JVD present. No tracheal deviation present or significant neck LA or mass Cardiovascular: Normal rate, regular rhythm, normal heart sounds and intact distal pulses.   Pulmonary/Chest: Effort normal and breath sounds without rales or wheezing  Abdominal: Soft. Bowel sounds are normal. NT. No HSM  Musculoskeletal: Normal range of motion. Exhibits no edema Lymphadenopathy: Has no cervical adenopathy.  Neurological: Pt is alert and oriented to person, place, and time. Pt has normal reflexes. No cranial nerve deficit. Motor grossly intact Skin: Skin is warm and dry. No rash noted or new ulcers Psychiatric:  Has normal mood and affect. Behavior is normal.  No other new exam findings    Assessment & Plan:

## 2016-12-22 NOTE — Assessment & Plan Note (Signed)
stable overall by history and exam, recent data reviewed with pt, and pt to continue medical treatment as before,  to f/u any worsening symptoms or concerns Lab Results  Component Value Date   HGBA1C 5.3 12/16/2016

## 2016-12-22 NOTE — Assessment & Plan Note (Signed)
For f/u psa as he is due  to f/u any worsening symptoms or concerns

## 2016-12-22 NOTE — Assessment & Plan Note (Signed)
Mild uncontrolled, to increase the avapro from 75 to 150, o/w stable overall by history and exam, recent data reviewed with pt, and pt to continue medical treatment as before,  to f/u any worsening symptoms or concerns BP Readings from Last 3 Encounters:  12/19/16 (!) 146/98  12/14/15 (!) 146/96  12/09/14 110/80

## 2016-12-22 NOTE — Assessment & Plan Note (Addendum)
Mild, for f/u lab and Abd u/s, consider Gi referral - declines for now  In addition to the time spent performing CPE, I spent an additional 15 minutes face to face,in which greater than 50% of this time was spent in counseling and coordination of care for patient's illness as documented.

## 2016-12-22 NOTE — Assessment & Plan Note (Signed)

## 2017-01-23 ENCOUNTER — Encounter: Payer: Self-pay | Admitting: Internal Medicine

## 2017-01-23 ENCOUNTER — Ambulatory Visit (INDEPENDENT_AMBULATORY_CARE_PROVIDER_SITE_OTHER): Payer: BLUE CROSS/BLUE SHIELD | Admitting: Internal Medicine

## 2017-01-23 VITALS — BP 154/110 | HR 101 | Temp 98.1°F | Ht 73.0 in | Wt 189.0 lb

## 2017-01-23 DIAGNOSIS — R7302 Impaired glucose tolerance (oral): Secondary | ICD-10-CM

## 2017-01-23 DIAGNOSIS — I10 Essential (primary) hypertension: Secondary | ICD-10-CM | POA: Diagnosis not present

## 2017-01-23 DIAGNOSIS — J019 Acute sinusitis, unspecified: Secondary | ICD-10-CM

## 2017-01-23 MED ORDER — LEVOFLOXACIN 500 MG PO TABS: 500.0000 mg | ORAL_TABLET | Freq: Every day | ORAL | 0 refills | Status: AC

## 2017-01-23 MED ORDER — IRBESARTAN-HYDROCHLOROTHIAZIDE 150-12.5 MG PO TABS: 1.0000 | ORAL_TABLET | Freq: Every day | ORAL | 3 refills | Status: DC

## 2017-01-23 NOTE — Patient Instructions (Signed)
Please take all new medication as prescribed-  The levaquin  OK to stop the avapro (irbesartan)  Please take all new medication as prescribed - the Avalide 150-12.5 mg - 1 per day  Please monitor your blood pressure; and call if not much better in 1 wk; the next option would be to take 2 of the Avalide if the lower dose pill does not work  Please continue all other medications as before, and refills have been done if requested.  Please have the pharmacy call with any other refills you may need.  Please keep your appointments with your specialists as you may have planned

## 2017-01-23 NOTE — Progress Notes (Signed)
Pre visit review using our clinic review tool, if applicable. No additional management support is needed unless otherwise documented below in the visit note. 

## 2017-01-25 NOTE — Assessment & Plan Note (Signed)
Uncontrolled, to change avapro 150 to avalide 1590/12.5, to call with BP in 1 wk, consider increase to 2 per day

## 2017-01-25 NOTE — Progress Notes (Signed)
Subjective:    Patient ID: Fred [name], male    DOB: 1961-04-17, 56 y.o.   MRN: [id number]  HPI   Here with 2-3 days acute onset fever, facial pain, pressure, headache, general weakness and malaise, and greenish d/c, with mild ST and cough, but pt denies chest pain, wheezing, increased sob or doe, orthopnea, PND, increased LE swelling, palpitations, dizziness or syncope.  Also however even prior to infectious symptoms, pt has elevated BP at home not well controlled.  Pt denies new neurological symptoms such as new headache, or facial or extremity weakness or numbness   Pt denies polydipsia, polyuria Past Medical History:  Diagnosis Date  . ALCOHOL ABUSE, EPISODIC, HX OF 02/15/2008  . ALLERGIC RHINITIS 02/15/2008  . DEPRESSION 07/28/2007  . HEARING LOSS 08/05/2008  . HYPERLIPIDEMIA 02/11/2008  . HYPERTENSION 07/28/2007  . PSA, INCREASED 10/01/2010  . TRANSAMINASES, SERUM, ELEVATED 09/01/2009  . Tubular adenoma of colon 10/2010   Past Surgical History:  Procedure Laterality Date  . COLONOSCOPY    . inguinal herniorrhapy  1976  . left foot surgery  08/2007  . POLYPECTOMY      reports that he has never smoked. He has never used smokeless tobacco. He reports that he does not drink alcohol or use drugs. family history includes Alcohol abuse in his mother; Colon cancer in his maternal grandmother; Depression in his mother; Hypertension in his father; Prostate cancer in his father. No Known Allergies Current Outpatient Prescriptions on File Prior to Visit  Medication Sig Dispense Refill  . aspirin EC 81 MG tablet Take 1 tablet (81 mg total) by mouth daily. 90 tablet 11  . atorvastatin (LIPITOR) 40 MG tablet Take 1 tablet (40 mg total) by mouth daily. 90 tablet 3  . buPROPion (WELLBUTRIN XL) 300 MG 24 hr tablet Take 1 tablet (300 mg total) by mouth daily. 90 tablet 3  . loratadine (CLARITIN) 10 MG tablet Take 10 mg by mouth daily.    . mirtazapine (REMERON) 45 MG tablet Take 1 tablet (45 mg  total) by mouth at bedtime. 90 tablet 3   No current facility-administered medications on file prior to visit.    Review of Systems  Constitutional: Negative for unusual diaphoresis or night sweats HENT: Negative for ear swelling or discharge Eyes: Negative for worsening visual haziness  Respiratory: Negative for choking and stridor.   Gastrointestinal: Negative for distension or worsening eructation Genitourinary: Negative for retention or change in urine volume.  Musculoskeletal: Negative for other MSK pain or swelling Skin: Negative for color change and worsening wound Neurological: Negative for tremors and numbness other than noted  Psychiatric/Behavioral: Negative for decreased concentration or agitation other than above   All other system neg per pt    Objective:   Physical Exam BP (!) 154/110 (BP Location: Left Arm, Patient Position: Sitting, Cuff Size: Normal)   Pulse (!) 101   Temp 98.1 F (36.7 C) (Oral)   Ht 6\' 1"  (1.854 m)   Wt 189 lb (85.7 kg)   SpO2 99%   BMI 24.94 kg/m  VS noted,  Constitutional: Pt appears in no apparent distress HENT: Head: NCAT.  Right Ear: External ear normal.  Left Ear: External ear normal.  Eyes: . Pupils are equal, round, and reactive to light. Conjunctivae and EOM are normal Bilat tm's with mild erythema.  Max sinus areas mild tender.  Pharynx with mild erythema, no exudate Neck: Normal range of motion. Neck supple.  Cardiovascular: Normal rate and regular rhythm.  Pulmonary/Chest: Effort normal and breath sounds without rales or wheezing.  Neurological: Pt is alert. Not confused , motor grossly intact Skin: Skin is warm. No rash, no LE edema Psychiatric: Pt behavior is normal. No agitation.  No other exam findings    Assessment & Plan:

## 2017-01-25 NOTE — Assessment & Plan Note (Signed)
stable overall by history and exam, recent data reviewed with pt, and pt to continue medical treatment as before,  to f/u any worsening symptoms or concerns Lab Results  Component Value Date   HGBA1C 5.3 12/16/2016   Pt to call for onset polys or cbg > 200 with illness

## 2017-01-25 NOTE — Assessment & Plan Note (Signed)
Mild to mod, for antibx course,  to f/u any worsening symptoms or concerns 

## 2017-02-22 ENCOUNTER — Other Ambulatory Visit: Payer: Self-pay | Admitting: Internal Medicine

## 2017-03-05 ENCOUNTER — Encounter: Payer: Self-pay | Admitting: Internal Medicine

## 2017-03-05 ENCOUNTER — Ambulatory Visit (INDEPENDENT_AMBULATORY_CARE_PROVIDER_SITE_OTHER): Payer: BLUE CROSS/BLUE SHIELD | Admitting: Internal Medicine

## 2017-03-05 VITALS — BP 140/100 | HR 83 | Temp 98.2°F | Ht 73.0 in | Wt 190.0 lb

## 2017-03-05 DIAGNOSIS — R109 Unspecified abdominal pain: Secondary | ICD-10-CM | POA: Diagnosis not present

## 2017-03-05 DIAGNOSIS — I10 Essential (primary) hypertension: Secondary | ICD-10-CM

## 2017-03-05 NOTE — Progress Notes (Signed)
Pre visit review using our clinic review tool, if applicable. No additional management support is needed unless otherwise documented below in the visit note. 

## 2017-03-05 NOTE — Patient Instructions (Signed)
OK to cont your soft GI diet for now  Please continue all other medications as before, and refills have been done if requested.  Please have the pharmacy call with any other refills you may need.  Please keep your appointments with your specialists as you may have planned  Please go to the LAB in the Basement (turn left off the elevator) for the tests to be done at your convenience (tomorrow)  You will be contacted by phone if any changes need to be made immediately.  Otherwise, you will receive a letter about your results with an explanation, but please check with MyChart first.  Please remember to sign up for MyChart if you have not done so, as this will be important to you in the future with finding out test results, communicating by private email, and scheduling acute appointments online when needed.  Please call on Monday if you would like to pursue the CT scan

## 2017-03-06 ENCOUNTER — Other Ambulatory Visit (INDEPENDENT_AMBULATORY_CARE_PROVIDER_SITE_OTHER): Payer: BLUE CROSS/BLUE SHIELD

## 2017-03-06 DIAGNOSIS — R7302 Impaired glucose tolerance (oral): Secondary | ICD-10-CM | POA: Diagnosis not present

## 2017-03-06 DIAGNOSIS — R109 Unspecified abdominal pain: Secondary | ICD-10-CM

## 2017-03-06 LAB — URINALYSIS, ROUTINE W REFLEX MICROSCOPIC
BILIRUBIN URINE: NEGATIVE
HGB URINE DIPSTICK: NEGATIVE
KETONES UR: NEGATIVE
LEUKOCYTES UA: NEGATIVE
Nitrite: NEGATIVE
PH: 7.5 (ref 5.0–8.0)
RBC / HPF: NONE SEEN (ref 0–?)
Specific Gravity, Urine: <=1.005 — AB (ref 1.000–1.030)
TOTAL PROTEIN, URINE-UPE24: NEGATIVE
UROBILINOGEN UA: 0.2 (ref 0.0–1.0)
Urine Glucose: NEGATIVE
WBC UA: NONE SEEN (ref 0–?)

## 2017-03-06 LAB — BASIC METABOLIC PANEL
BUN: 11 mg/dL (ref 6–23)
CALCIUM: 9.9 mg/dL (ref 8.4–10.5)
CHLORIDE: 103 meq/L (ref 96–112)
CO2: 33 meq/L — AB (ref 19–32)
CREATININE: 1.06 mg/dL (ref 0.40–1.50)
GFR: 76.67 mL/min (ref >60.00)
GLUCOSE: 84 mg/dL (ref 70–99)
Potassium: 4.1 mEq/L (ref 3.5–5.1)
Sodium: 140 mEq/L (ref 135–145)

## 2017-03-06 LAB — CBC WITH DIFFERENTIAL/PLATELET
BASOS ABS: 0 10*3/uL (ref 0.0–0.1)
Basophils Relative: 0.5 % (ref 0.0–3.0)
EOS ABS: 0.1 10*3/uL (ref 0.0–0.7)
Eosinophils Relative: 1 % (ref 0.0–5.0)
HEMATOCRIT: 44.4 % (ref 39.0–52.0)
Hemoglobin: 15.1 g/dL (ref 13.0–17.0)
LYMPHS PCT: 31.6 % (ref 12.0–46.0)
Lymphs Abs: 2.3 10*3/uL (ref 0.7–4.0)
MCHC: 34 g/dL (ref 30.0–36.0)
MCV: 92.8 fl (ref 78.0–100.0)
MONOS PCT: 8.3 % (ref 3.0–12.0)
Monocytes Absolute: 0.6 10*3/uL (ref 0.1–1.0)
NEUTROS PCT: 58.6 % (ref 43.0–77.0)
Neutro Abs: 4.2 10*3/uL (ref 1.4–7.7)
Platelets: 311 10*3/uL (ref 150.0–400.0)
RBC: 4.78 Mil/uL (ref 4.22–5.81)
RDW: 14.5 % (ref 11.5–15.5)
WBC: 7.2 10*3/uL (ref 4.0–10.5)

## 2017-03-06 LAB — HEPATIC FUNCTION PANEL
ALBUMIN: 4.4 g/dL (ref 3.5–5.2)
ALT: 24 U/L (ref 0–53)
AST: 16 U/L (ref 0–37)
Alkaline Phosphatase: 69 U/L (ref 39–117)
Bilirubin, Direct: 0.2 mg/dL (ref 0.0–0.3)
TOTAL PROTEIN: 6.8 g/dL (ref 6.0–8.3)
Total Bilirubin: 0.8 mg/dL (ref 0.2–1.2)

## 2017-03-06 LAB — LIPID PANEL
Cholesterol: 146 mg/dL (ref 0–200)
HDL: 42.7 mg/dL (ref >39.00)
LDL Cholesterol: 74 mg/dL (ref 0–99)
NONHDL: 102.85
TRIGLYCERIDES: 144 mg/dL (ref 0.0–149.0)
Total CHOL/HDL Ratio: 3
VLDL: 28.8 mg/dL (ref 0.0–40.0)

## 2017-03-06 LAB — HEMOGLOBIN A1C: HEMOGLOBIN A1C: 5.4 % (ref 4.6–6.5)

## 2017-03-06 LAB — LIPASE: LIPASE: 27 U/L (ref 11.0–59.0)

## 2017-03-06 LAB — TSH: TSH: 2.5 u[IU]/mL (ref 0.35–4.50)

## 2017-03-06 NOTE — Progress Notes (Signed)
Subjective:    Patient ID: Fred [name], male    DOB: Mar 11, 1961, 56 y.o.   MRN: [id number]  HPI   Here to f/u with c/o bloating and gassy for 2-3 days, with mild abd discomfort, somewhat crampy, intermittent, without fever and Denies worsening reflux, dysphagia, n/v, or blood.  Not better with soft bland diet, last BM soft and this AM; worst pain seems to be in 2 places - right mid lateral abd and left lateral upper abd.  Actually has little to no pain when first gets up in the AM, then worse later in the day.   Pt denies polydipsia, polyuria. No prior colonscopy, and declines at this time  Cologuard neg 2017 Past Medical History:  Diagnosis Date  . ALCOHOL ABUSE, EPISODIC, HX OF 02/15/2008  . ALLERGIC RHINITIS 02/15/2008  . DEPRESSION 07/28/2007  . HEARING LOSS 08/05/2008  . HYPERLIPIDEMIA 02/11/2008  . HYPERTENSION 07/28/2007  . PSA, INCREASED 10/01/2010  . TRANSAMINASES, SERUM, ELEVATED 09/01/2009  . Tubular adenoma of colon 10/2010   Past Surgical History:  Procedure Laterality Date  . COLONOSCOPY    . inguinal herniorrhapy  1976  . left foot surgery  08/2007  . POLYPECTOMY      reports that he has never smoked. He has never used smokeless tobacco. He reports that he does not drink alcohol or use drugs. family history includes Alcohol abuse in his mother; Colon cancer in his maternal grandmother; Depression in his mother; Hypertension in his father; Prostate cancer in his father. No Known Allergies Current Outpatient Prescriptions on File Prior to Visit  Medication Sig Dispense Refill  . aspirin EC 81 MG tablet Take 1 tablet (81 mg total) by mouth daily. 90 tablet 11  . atorvastatin (LIPITOR) 40 MG tablet Take 1 tablet (40 mg total) by mouth daily. 90 tablet 3  . buPROPion (WELLBUTRIN XL) 300 MG 24 hr tablet Take 1 tablet (300 mg total) by mouth daily. 90 tablet 3  . irbesartan (AVAPRO) 75 MG tablet TAKE 1 TABLET BY MOUTH EVERY DAY 90 tablet 2  . irbesartan-hydrochlorothiazide  (AVALIDE) 150-12.5 MG tablet Take 1 tablet by mouth daily. 90 tablet 3  . loratadine (CLARITIN) 10 MG tablet Take 10 mg by mouth daily.    . mirtazapine (REMERON) 45 MG tablet Take 1 tablet (45 mg total) by mouth at bedtime. 90 tablet 3   No current facility-administered medications on file prior to visit.    Review of Systems All otherwise neg per pt     Objective:   Physical Exam BP (!) 140/100   Pulse 83   Temp 98.2 F (36.8 C) (Oral)   Ht 6\' 1"  (1.854 m)   Wt 190 lb (86.2 kg)   SpO2 100%   BMI 25.07 kg/m  VS noted,  Constitutional: Pt appears in NAD HENT: Head: NCAT.  Right Ear: External ear normal.  Left Ear: External ear normal.  Eyes: . Pupils are equal, round, and reactive to light. Conjunctivae and EOM are normal Nose: without d/c or deformity Neck: Neck supple. Gross normal ROM Cardiovascular: Normal rate and regular rhythm.   Pulmonary/Chest: Effort normal and breath sounds without rales or wheezing.  Abd:  Soft, NT, ND, + BS, no organomegaly - benign exam, no flank tender Neurological: Pt is alert. At baseline orientation, motor grossly intact Skin: Skin is warm. No rashes, other new lesions, no LE edema Psychiatric: Pt behavior is normal without agitation  No other exam findgings    Assessment &  Plan:

## 2017-03-08 NOTE — Assessment & Plan Note (Signed)
Mild elevated, likely situational, o/w stable overall by history and exam, recent data reviewed with pt, and pt to continue medical treatment as before - declines change in tx today,  to f/u any worsening symptoms or concerns

## 2017-03-08 NOTE — Assessment & Plan Note (Addendum)
Etiology unclear, but suspect functional abd pain likely due to intermittent constipation, mild to mod for 2-3 days; declines miralax suggestion for now, but to cont soft GI diet, for labs in AM (lab closed tonight), and consider CT if worsening or not improved in 5-7 days, consider GI referral

## 2017-05-30 ENCOUNTER — Ambulatory Visit (INDEPENDENT_AMBULATORY_CARE_PROVIDER_SITE_OTHER): Payer: BLUE CROSS/BLUE SHIELD | Admitting: Internal Medicine

## 2017-05-30 VITALS — BP 124/80 | HR 68 | Temp 97.9°F | Ht 73.0 in | Wt 196.0 lb

## 2017-05-30 DIAGNOSIS — R109 Unspecified abdominal pain: Secondary | ICD-10-CM

## 2017-05-30 DIAGNOSIS — I10 Essential (primary) hypertension: Secondary | ICD-10-CM

## 2017-05-30 DIAGNOSIS — R7302 Impaired glucose tolerance (oral): Secondary | ICD-10-CM | POA: Diagnosis not present

## 2017-05-30 MED ORDER — PANTOPRAZOLE SODIUM 40 MG PO TBEC: 40.0000 mg | DELAYED_RELEASE_TABLET | Freq: Every day | ORAL | 3 refills | Status: DC

## 2017-05-30 NOTE — Patient Instructions (Signed)
Please take all new medication as prescribed - the protonix 40 mg daily  Please also take the Gas-X type medication as directed OTC  Please continue all other medications as before, and refills have been done if requested.  Please have the pharmacy call with any other refills you may need.  Please keep your appointments with your specialists as you may have planned  You will be contacted regarding the referral for: Dr Justin Mend

## 2017-05-30 NOTE — Progress Notes (Signed)
Subjective:    Patient ID: Fred [name], male    DOB: 1961-05-28, 56 y.o.   MRN: [id number]  HPI  Here with c/o persistent abd symptoms primarily of left > right side abd pressure and bloating wih a kind of mild discomfort without radiation, n/v, fever, and Denies worsening reflux, dysphagia, n/v, bowel change or blood.  Appetite ok and no wt change except to increase several lbs Wt Readings from Last 3 Encounters:  05/30/17 196 lb (88.9 kg)  03/05/17 190 lb (86.2 kg)  01/23/17 189 lb (85.7 kg)  Can improve somewhat with diet changes but non specific.  Pt denies chest pain, increased sob or doe, wheezing, orthopnea, PND, increased LE swelling, palpitations, dizziness or syncope.   Pt denies fever, wt loss, night sweats, loss of appetite, or other constitutional symptoms.   Pt denies polydipsia, polyuria  Past Medical History:  Diagnosis Date  . ALCOHOL ABUSE, EPISODIC, HX OF 02/15/2008  . ALLERGIC RHINITIS 02/15/2008  . DEPRESSION 07/28/2007  . HEARING LOSS 08/05/2008  . HYPERLIPIDEMIA 02/11/2008  . HYPERTENSION 07/28/2007  . PSA, INCREASED 10/01/2010  . TRANSAMINASES, SERUM, ELEVATED 09/01/2009  . Tubular adenoma of colon 10/2010   Past Surgical History:  Procedure Laterality Date  . COLONOSCOPY    . inguinal herniorrhapy  1976  . left foot surgery  08/2007  . POLYPECTOMY      reports that he has never smoked. He has never used smokeless tobacco. He reports that he does not drink alcohol or use drugs. family history includes Alcohol abuse in his mother; Colon cancer in his maternal grandmother; Depression in his mother; Hypertension in his father; Prostate cancer in his father. No Known Allergies Current Outpatient Prescriptions on File Prior to Visit  Medication Sig Dispense Refill  . aspirin EC 81 MG tablet Take 1 tablet (81 mg total) by mouth daily. 90 tablet 11  . atorvastatin (LIPITOR) 40 MG tablet Take 1 tablet (40 mg total) by mouth daily. 90 tablet 3  . buPROPion (WELLBUTRIN  XL) 300 MG 24 hr tablet Take 1 tablet (300 mg total) by mouth daily. 90 tablet 3  . irbesartan (AVAPRO) 75 MG tablet TAKE 1 TABLET BY MOUTH EVERY DAY 90 tablet 2  . irbesartan-hydrochlorothiazide (AVALIDE) 150-12.5 MG tablet Take 1 tablet by mouth daily. 90 tablet 3  . loratadine (CLARITIN) 10 MG tablet Take 10 mg by mouth daily.    . mirtazapine (REMERON) 45 MG tablet Take 1 tablet (45 mg total) by mouth at bedtime. 90 tablet 3   No current facility-administered medications on file prior to visit.    Review of Systems  Constitutional: Negative for other unusual diaphoresis or sweats HENT: Negative for ear discharge or swelling Eyes: Negative for other worsening visual disturbances Respiratory: Negative for stridor or other swelling  Gastrointestinal: Negative for worsening distension or other blood Genitourinary: Negative for retention or other urinary change Musculoskeletal: Negative for other MSK pain or swelling Skin: Negative for color change or other new lesions Neurological: Negative for worsening tremors and other numbness  Psychiatric/Behavioral: Negative for worsening agitation or other fatigue All other system neg per pt    Objective:   Physical Exam BP 124/80 (BP Location: Left Arm, Patient Position: Sitting, Cuff Size: Normal)   Pulse 68   Temp 97.9 F (36.6 C) (Oral)   Ht 6\' 1"  (1.854 m)   Wt 196 lb (88.9 kg)   SpO2 100%   BMI 25.86 kg/m  VS noted,  Constitutional: Pt appears in  NAD HENT: Head: NCAT.  Right Ear: External ear normal.  Left Ear: External ear normal.  Eyes: . Pupils are equal, round, and reactive to light. Conjunctivae and EOM are normal Nose: without d/c or deformity Neck: Neck supple. Gross normal ROM Cardiovascular: Normal rate and regular rhythm.   Pulmonary/Chest: Effort normal and breath sounds without rales or wheezing.  Abd:  Soft, NT, ND, + BS, no organomegaly  Benign exam, no flank tender Neurological: Pt is alert. At baseline  orientation, motor grossly intact Skin: Skin is warm. No rashes, other new lesions, no LE edema Psychiatric: Pt behavior is normal without agitation  No other exam findings Lab Results  Component Value Date   WBC 7.2 03/06/2017   HGB 15.1 03/06/2017   HCT 44.4 03/06/2017   PLT 311.0 03/06/2017   GLUCOSE 84 03/06/2017   CHOL 146 03/06/2017   TRIG 144.0 03/06/2017   HDL 42.70 03/06/2017   LDLDIRECT 163.2 11/03/2012   LDLCALC 74 03/06/2017   ALT 24 03/06/2017   AST 16 03/06/2017   NA 140 03/06/2017   K 4.1 03/06/2017   CL 103 03/06/2017   CREATININE 1.06 03/06/2017   BUN 11 03/06/2017   CO2 33 (H) 03/06/2017   TSH 2.50 03/06/2017   PSA 11.04 (H) 12/19/2016   HGBA1C 5.4 03/06/2017       Assessment & Plan:

## 2017-05-30 NOTE — Progress Notes (Signed)
Pre visit review using our clinic review tool, if applicable. No additional management support is needed unless otherwise documented below in the visit note. 

## 2017-05-31 NOTE — Assessment & Plan Note (Signed)
stable overall by history and exam, recent data reviewed with pt, and pt to continue medical treatment as before,  to f/u any worsening symptoms or concerns BP Readings from Last 3 Encounters:  05/30/17 124/80  03/05/17 (!) 140/100  01/23/17 (!) 154/110

## 2017-05-31 NOTE — Assessment & Plan Note (Addendum)
I suspect possible functional issue, exam benign, recent labs OK but due to chronicity not improving pt reqeusts GI evaluation, may need colonoscopy?

## 2017-05-31 NOTE — Assessment & Plan Note (Signed)
stable overall by history and exam, recent data reviewed with pt, and pt to continue medical treatment as before,  to f/u any worsening symptoms or concerns Lab Results  Component Value Date   HGBA1C 5.4 03/06/2017

## 2017-06-06 DIAGNOSIS — Z85828 Personal history of other malignant neoplasm of skin: Secondary | ICD-10-CM | POA: Diagnosis not present

## 2017-06-06 DIAGNOSIS — L821 Other seborrheic keratosis: Secondary | ICD-10-CM | POA: Diagnosis not present

## 2017-06-06 DIAGNOSIS — L820 Inflamed seborrheic keratosis: Secondary | ICD-10-CM | POA: Diagnosis not present

## 2017-06-06 DIAGNOSIS — D2261 Melanocytic nevi of right upper limb, including shoulder: Secondary | ICD-10-CM | POA: Diagnosis not present

## 2017-06-06 DIAGNOSIS — L819 Disorder of pigmentation, unspecified: Secondary | ICD-10-CM | POA: Diagnosis not present

## 2017-08-05 ENCOUNTER — Ambulatory Visit (INDEPENDENT_AMBULATORY_CARE_PROVIDER_SITE_OTHER): Payer: BLUE CROSS/BLUE SHIELD | Admitting: Gastroenterology

## 2017-08-05 ENCOUNTER — Encounter: Payer: Self-pay | Admitting: Gastroenterology

## 2017-08-05 ENCOUNTER — Other Ambulatory Visit (INDEPENDENT_AMBULATORY_CARE_PROVIDER_SITE_OTHER): Payer: BLUE CROSS/BLUE SHIELD

## 2017-08-05 VITALS — BP 128/8 | HR 81 | Ht 73.0 in | Wt 199.0 lb

## 2017-08-05 DIAGNOSIS — R1010 Upper abdominal pain, unspecified: Secondary | ICD-10-CM

## 2017-08-05 DIAGNOSIS — R1011 Right upper quadrant pain: Secondary | ICD-10-CM | POA: Diagnosis not present

## 2017-08-05 DIAGNOSIS — Z86010 Personal history of colonic polyps: Secondary | ICD-10-CM | POA: Diagnosis not present

## 2017-08-05 DIAGNOSIS — R1012 Left upper quadrant pain: Secondary | ICD-10-CM

## 2017-08-05 LAB — CBC WITH DIFFERENTIAL/PLATELET
Basophils Absolute: 0 10*3/uL (ref 0.0–0.1)
Basophils Relative: 0.4 % (ref 0.0–3.0)
EOS ABS: 0.1 10*3/uL (ref 0.0–0.7)
Eosinophils Relative: 2.1 % (ref 0.0–5.0)
HCT: 43.3 % (ref 39.0–52.0)
Hemoglobin: 14.8 g/dL (ref 13.0–17.0)
LYMPHS ABS: 2.1 10*3/uL (ref 0.7–4.0)
LYMPHS PCT: 29.7 % (ref 12.0–46.0)
MCHC: 34 g/dL (ref 30.0–36.0)
MCV: 95.4 fl (ref 78.0–100.0)
Monocytes Absolute: 0.7 10*3/uL (ref 0.1–1.0)
Monocytes Relative: 9.7 % (ref 3.0–12.0)
NEUTROS ABS: 4 10*3/uL (ref 1.4–7.7)
NEUTROS PCT: 58.1 % (ref 43.0–77.0)
PLATELETS: 269 10*3/uL (ref 150.0–400.0)
RBC: 4.54 Mil/uL (ref 4.22–5.81)
RDW: 13.8 % (ref 11.5–15.5)
WBC: 7 10*3/uL (ref 4.0–10.5)

## 2017-08-05 LAB — COMPREHENSIVE METABOLIC PANEL
ALT: 58 U/L — AB (ref 0–53)
AST: 32 U/L (ref 0–37)
Albumin: 4.4 g/dL (ref 3.5–5.2)
Alkaline Phosphatase: 64 U/L (ref 39–117)
BUN: 16 mg/dL (ref 6–23)
CALCIUM: 9.8 mg/dL (ref 8.4–10.5)
CHLORIDE: 104 meq/L (ref 96–112)
CO2: 28 meq/L (ref 19–32)
CREATININE: 1.12 mg/dL (ref 0.40–1.50)
GFR: 71.84 mL/min (ref >60.00)
GLUCOSE: 98 mg/dL (ref 70–99)
Potassium: 3.9 mEq/L (ref 3.5–5.1)
Sodium: 140 mEq/L (ref 135–145)
Total Bilirubin: 0.6 mg/dL (ref 0.2–1.2)
Total Protein: 7.1 g/dL (ref 6.0–8.3)

## 2017-08-05 LAB — LIPASE: Lipase: 25 U/L (ref 11.0–59.0)

## 2017-08-05 MED ORDER — DICYCLOMINE HCL 10 MG PO CAPS: 10.0000 mg | ORAL_CAPSULE | Freq: Three times a day (TID) | ORAL | 11 refills | Status: DC

## 2017-08-05 NOTE — Patient Instructions (Signed)
We have sent the following medications to your pharmacy for you to pick up at your convenience: dicyclomine.   Your physician has requested that you go to the basement for lab work before leaving today.  You have been scheduled for a CT scan of the abdomen and pelvis at [facility] ([address]---this is in the same building as [facility]).   You are scheduled on 08/14/17 at 10:00am. You should arrive 15 minutes prior to your appointment time for registration. Please follow the written instructions below on the day of your exam:  WARNING: IF YOU ARE ALLERGIC TO IODINE/X-RAY DYE, PLEASE NOTIFY RADIOLOGY IMMEDIATELY AT [phone]! YOU WILL BE GIVEN A 13 HOUR PREMEDICATION PREP.  1) Do not eat or drink anything after 6:00am (4 hours prior to your test) 2) You have been given 2 bottles of oral contrast to drink. The solution may taste               better if refrigerated, but do NOT add ice or any other liquid to this solution. Shake             well before drinking.    Drink 1 bottle of contrast @ 8:00am (2 hours prior to your exam)  Drink 1 bottle of contrast @ 9:00am (1 hour prior to your exam)  You may take any medications as prescribed with a small amount of water except for the following: Metformin, Glucophage, Glucovance, Avandamet, Riomet, Fortamet, Actoplus Met, Janumet, Glumetza or Metaglip. The above medications must be held the day of the exam AND 48 hours after the exam.  The purpose of you drinking the oral contrast is to aid in the visualization of your intestinal tract. The contrast solution may cause some diarrhea. Before your exam is started, you will be given a small amount of fluid to drink. Depending on your individual set of symptoms, you may also receive an intravenous injection of x-ray contrast/dye. Plan on being at [facility] for 30 minutes or longer, depending on the type of exam you are having performed.  This test typically takes 30-45  minutes to complete.  If you have any questions regarding your exam or if you need to reschedule, you may call the CT department at [phone] between the hours of 8:00 am and 5:00 pm, Monday-Friday.  ________________________________________________________________________  Normal BMI (Body Mass Index- based on height and weight) is between 19 and 25. Your BMI today is Body mass index is 26.25 kg/m. Marland Kitchen Please consider follow up  regarding your BMI with your Primary Care Provider.  Thank you for choosing me and [facility].  Pricilla Riffle. Dagoberto Ligas., MD., Mar[facility] Regal

## 2017-08-05 NOTE — Progress Notes (Signed)
History of Present Illness: This is a 56 year old male referred by Biagio Borg, MD for the evaluation of right upper quadrant pain, left upper quadrant pain, bloating and left back pain. His symptoms have been present since April of this year they have been waxing and waning. He occasionally notes more bloating following meals but bloating symptoms occur at many other times. Pain is not associated with meals or bowel movements. He generally has 2 bowel movements per day and this pattern has not changed. Blood work performed in April was unremarkable. Abdominal ultrasound and CCK HIDA performed in 2013 and 2014 respectively were normal. He was placed on Protonix by Dr. Jenny Reichmann with no change in symptoms. He had a Cologuard performed in February 2017 by his PCP which was negative. Patient had an adenomatous colon polyp on colonoscopy performed in December 2011 by Dr. Sharlett Iles. Denies weight loss, constipation, diarrhea, change in stool caliber, melena, hematochezia, nausea, vomiting, dysphagia, reflux symptoms, chest pain.  EGD 12/2012 showed erosive gastritis.   No Known Allergies Outpatient Medications Prior to Visit  Medication Sig Dispense Refill  . aspirin EC 81 MG tablet Take 1 tablet (81 mg total) by mouth daily. 90 tablet 11  . atorvastatin (LIPITOR) 40 MG tablet Take 1 tablet (40 mg total) by mouth daily. 90 tablet 3  . buPROPion (WELLBUTRIN XL) 300 MG 24 hr tablet Take 1 tablet (300 mg total) by mouth daily. 90 tablet 3  . irbesartan-hydrochlorothiazide (AVALIDE) 150-12.5 MG tablet Take 1 tablet by mouth daily. 90 tablet 3  . loratadine (CLARITIN) 10 MG tablet Take 10 mg by mouth daily.    . mirtazapine (REMERON) 45 MG tablet Take 1 tablet (45 mg total) by mouth at bedtime. 90 tablet 3  . pantoprazole (PROTONIX) 40 MG tablet Take 1 tablet (40 mg total) by mouth daily. 90 tablet 3  . irbesartan (AVAPRO) 75 MG tablet TAKE 1 TABLET BY MOUTH EVERY DAY 90 tablet 2   No facility-administered  medications prior to visit.    Past Medical History:  Diagnosis Date  . ALCOHOL ABUSE, EPISODIC, HX OF 02/15/2008  . ALLERGIC RHINITIS 02/15/2008  . DEPRESSION 07/28/2007  . HEARING LOSS 08/05/2008  . HYPERLIPIDEMIA 02/11/2008  . HYPERTENSION 07/28/2007  . PSA, INCREASED 10/01/2010  . TRANSAMINASES, SERUM, ELEVATED 09/01/2009  . Tubular adenoma of colon 10/2010   Past Surgical History:  Procedure Laterality Date  . COLONOSCOPY    . inguinal herniorrhapy  1976  . left foot surgery  08/2007  . POLYPECTOMY     Social History   Social History  . Marital status: Single    Spouse name: N/A  . Number of children: N/A  . Years of education: N/A   Occupational History  . [employer]   Social History Main Topics  . Smoking status: Never Smoker  . Smokeless tobacco: Never Used  . Alcohol use No  . Drug use: No  . Sexual activity: Yes    Partners: Female   Other Topics Concern  . None   Social History Narrative  . None   Family History  Problem Relation Age of Onset  . Depression Mother   . Alcohol abuse Mother   . Hypertension Father   . Prostate cancer Father   . Colon polyps Maternal Grandmother   . Rectal cancer Neg Hx   . Stomach cancer Neg Hx   . Esophageal cancer Neg Hx  Review of Systems: Pertinent positive and negative review of systems were noted in the above HPI section. All other review of systems were otherwise negative.    Physical Exam: General: Well developed, well nourished, no acute distress Head: Normocephalic and atraumatic Eyes:  sclerae anicteric, EOMI Ears: Normal auditory acuity Mouth: No deformity or lesions Neck: Supple, no masses or thyromegaly Lungs: Clear throughout to auscultation Heart: Regular rate and rhythm; no murmurs, rubs or bruits Abdomen: Soft, mild LUQ tenderness and non distended. No masses, hepatosplenomegaly or hernias noted. Normal Bowel sounds Rectal: not done Musculoskeletal:  Symmetrical with no gross deformities  Skin: No lesions on visible extremities Pulses:  Normal pulses noted Extremities: No clubbing, cyanosis, edema or deformities noted Neurological: Alert oriented x 4, grossly nonfocal Cervical Nodes:  No significant cervical adenopathy Inguinal Nodes: No significant inguinal adenopathy Psychological:  Alert and cooperative. Normal mood and affect  Assessment and Recommendations:  1. RUQ, LUQ abdominal pain, bloating. Obtain CBC, CMP, lipase, tTG and IgA. Schedule abdominal and pelvic CT. Begin dicyclomine 10 mg 3 times a day before meals. REV in 6 weeks.   2. Personal history of adenomatous colon polyps. Although Cologuard was negative in February 2017 we discussed surveillance colonoscopy as the recommended strategy for follow-up of adenomatous colon polyps. Will schedule colonoscopy at his REV.  3. History of erosive gastritis. Continue Protonix daily for now.  cc: Biagio Borg, MD [address], [place] [id number]

## 2017-08-06 ENCOUNTER — Other Ambulatory Visit: Payer: Self-pay

## 2017-08-06 DIAGNOSIS — R945 Abnormal results of liver function studies: Principal | ICD-10-CM

## 2017-08-06 DIAGNOSIS — R7989 Other specified abnormal findings of blood chemistry: Secondary | ICD-10-CM

## 2017-08-07 ENCOUNTER — Other Ambulatory Visit: Payer: Self-pay

## 2017-08-07 ENCOUNTER — Other Ambulatory Visit (INDEPENDENT_AMBULATORY_CARE_PROVIDER_SITE_OTHER): Payer: BLUE CROSS/BLUE SHIELD

## 2017-08-07 DIAGNOSIS — R1011 Right upper quadrant pain: Secondary | ICD-10-CM

## 2017-08-07 DIAGNOSIS — R1012 Left upper quadrant pain: Secondary | ICD-10-CM

## 2017-08-07 DIAGNOSIS — R7989 Other specified abnormal findings of blood chemistry: Secondary | ICD-10-CM | POA: Diagnosis not present

## 2017-08-07 DIAGNOSIS — R1010 Upper abdominal pain, unspecified: Secondary | ICD-10-CM | POA: Diagnosis not present

## 2017-08-07 DIAGNOSIS — R945 Abnormal results of liver function studies: Secondary | ICD-10-CM

## 2017-08-07 LAB — PROTIME-INR
INR: 1 ratio (ref 0.8–1.0)
Prothrombin Time: 11.3 s (ref 9.6–13.1)

## 2017-08-07 LAB — IGA: IgA: 149 mg/dL (ref 68–378)

## 2017-08-07 LAB — IBC PANEL
IRON: 152 ug/dL (ref 42–165)
Saturation Ratios: 53.5 % — ABNORMAL HIGH (ref 20.0–50.0)
TRANSFERRIN: 203 mg/dL — AB (ref 212.0–360.0)

## 2017-08-07 LAB — FERRITIN: FERRITIN: 369.8 ng/mL — AB (ref 22.0–322.0)

## 2017-08-11 LAB — ANA: ANA: POSITIVE — AB

## 2017-08-11 LAB — MITOCHONDRIAL ANTIBODIES

## 2017-08-11 LAB — ANTI-NUCLEAR AB-TITER (ANA TITER): ANA Titer 1: 1:40 {titer} — ABNORMAL HIGH

## 2017-08-11 LAB — CERULOPLASMIN: Ceruloplasmin: 27 mg/dL (ref 18–36)

## 2017-08-11 LAB — ANTI-SMOOTH MUSCLE ANTIBODY, IGG

## 2017-08-11 LAB — HEPATITIS B SURFACE ANTIGEN: HEP B S AG: NONREACTIVE

## 2017-08-11 LAB — HEPATITIS C ANTIBODY
HEP C AB: NONREACTIVE
SIGNAL TO CUT-OFF: 0.01 (ref <1.00)

## 2017-08-11 LAB — TISSUE TRANSGLUTAMINASE, IGA: (TTG) AB, IGA: 1 U/mL

## 2017-08-11 LAB — HEPATITIS B SURFACE ANTIBODY, QUANTITATIVE: Hep B S AB Quant (Post): <5 m[IU]/mL — ABNORMAL LOW (ref >=10)

## 2017-08-11 LAB — ALPHA-1-ANTITRYPSIN: A-1 Antitrypsin, Ser: 138 mg/dL (ref 83–199)

## 2017-08-14 ENCOUNTER — Ambulatory Visit (INDEPENDENT_AMBULATORY_CARE_PROVIDER_SITE_OTHER)
Admission: RE | Admit: 2017-08-14 | Discharge: 2017-08-14 | Disposition: A | Discharge status: Home / Self Care | Payer: BLUE CROSS/BLUE SHIELD | Source: Ambulatory Visit | Attending: Gastroenterology | Admitting: Gastroenterology

## 2017-08-14 DIAGNOSIS — R1032 Left lower quadrant pain: Secondary | ICD-10-CM | POA: Diagnosis not present

## 2017-08-14 DIAGNOSIS — R1012 Left upper quadrant pain: Secondary | ICD-10-CM

## 2017-08-14 DIAGNOSIS — R1011 Right upper quadrant pain: Secondary | ICD-10-CM | POA: Diagnosis not present

## 2017-08-14 DIAGNOSIS — R1010 Upper abdominal pain, unspecified: Secondary | ICD-10-CM | POA: Diagnosis not present

## 2017-08-14 MED ORDER — IOPAMIDOL (ISOVUE-300) INJECTION 61%
100.0000 mL | Freq: Once | INTRAVENOUS | Status: AC | PRN
  Administered 2017-08-14: 100 mL via INTRAVENOUS

## 2017-09-16 ENCOUNTER — Ambulatory Visit (INDEPENDENT_AMBULATORY_CARE_PROVIDER_SITE_OTHER): Payer: BLUE CROSS/BLUE SHIELD | Admitting: Gastroenterology

## 2017-09-16 ENCOUNTER — Encounter: Payer: Self-pay | Admitting: Gastroenterology

## 2017-09-16 ENCOUNTER — Other Ambulatory Visit: Payer: BLUE CROSS/BLUE SHIELD

## 2017-09-16 ENCOUNTER — Ambulatory Visit: Payer: BLUE CROSS/BLUE SHIELD | Admitting: Gastroenterology

## 2017-09-16 VITALS — BP 142/98 | HR 92 | Ht 73.0 in | Wt 196.0 lb

## 2017-09-16 DIAGNOSIS — Z86010 Personal history of colonic polyps: Secondary | ICD-10-CM | POA: Diagnosis not present

## 2017-09-16 DIAGNOSIS — R1012 Left upper quadrant pain: Secondary | ICD-10-CM

## 2017-09-16 DIAGNOSIS — R7989 Other specified abnormal findings of blood chemistry: Secondary | ICD-10-CM

## 2017-09-16 DIAGNOSIS — R945 Abnormal results of liver function studies: Secondary | ICD-10-CM

## 2017-09-16 DIAGNOSIS — R1011 Right upper quadrant pain: Secondary | ICD-10-CM

## 2017-09-16 MED ORDER — NA SULFATE-K SULFATE-MG SULF 17.5-3.13-1.6 GM/177ML PO SOLN: 1.0000 | Freq: Once | ORAL | 0 refills | Status: AC

## 2017-09-16 NOTE — Patient Instructions (Signed)
Your physician has requested that you go to the basement for lab work before leaving today.  You have been scheduled for a colonoscopy. Please follow written instructions given to you at your visit today.  Please pick up your prep supplies at the pharmacy within the next 1-3 days. If you use inhalers (even only as needed), please bring them with you on the day of your procedure. Your physician has requested that you go to www.startemmi.com and enter the access code given to you at your visit today. This web site gives a general overview about your procedure. However, you should still follow specific instructions given to you by our office regarding your preparation for the procedure.  Thank you for choosing me and [facility].  Malcolm T. Stark, Jr., MD., FACG  

## 2017-09-16 NOTE — Progress Notes (Signed)
    History of Present Illness: This is a 56 year old male with resolved abdominal pain and abnormal blood work: ALT=58, ANA 1:40, ferritin=369.8, Fe sat=53.5%, transferrin=203.  Dicyclomine completely resolved his abdominal complaints and he discontinued pantoprazole.  He states that he drank alcohol heavily in the past which may explain his iron studies.  Quit drinking in 2016.  Abd/pelvic CT  07/2017 1. No acute abdominopelvic findings. 2. Normal appendix. 3. No gastrointestinal abnormality identified.  Current Medications, Allergies, Past Medical History, Past Surgical History, Family History and Social History were reviewed in [employer] record.  Physical Exam: General: Well developed, well nourished, no acute distress Head: Normocephalic and atraumatic Eyes:  sclerae anicteric, EOMI Ears: Normal auditory acuity Mouth: No deformity or lesions Lungs: Clear throughout to auscultation Heart: Regular rate and rhythm; no murmurs, rubs or bruits Abdomen: Soft, non tender and non distended. No masses, hepatosplenomegaly or hernias noted. Normal Bowel sounds Rectal: deferred to colonoscopy Musculoskeletal: Symmetrical with no gross deformities  Pulses:  Normal pulses noted Extremities: No clubbing, cyanosis, edema or deformities noted Neurological: Alert oriented x 4, grossly nonfocal Psychological:  Alert and cooperative. Normal mood and affect  Assessment and Recommendations:  1.  LUQ, RUQ abdominal pain, resolved with dicyclomine tid as needed.  2.  Elevated ALT.  Hepatic serologies unremarkable.  Send hemochromatosis DNA although studies are likely related to prior heavy alcohol use.  Repeat LFTs in 3 months.  3.  Personal history of adenomatous colon polyps in 2011. Cologuard was negative in February 2016 however this study is not indicated or adequate in the setting of a prior history of cancerous colon polyps.  Schedule colonoscopy.The risks (including  bleeding, perforation, infection, missed lesions, medication reactions and possible hospitalization or surgery if complications occur), benefits, and alternatives to colonoscopy with possible biopsy and possible polypectomy were discussed with the patient and they consent to proceed.

## 2017-09-21 LAB — HEMOCHROMATOSIS DNA-PCR(C282Y,H63D)

## 2017-09-24 ENCOUNTER — Other Ambulatory Visit: Payer: Self-pay | Admitting: Internal Medicine

## 2017-09-26 ENCOUNTER — Telehealth: Payer: Self-pay | Admitting: Gastroenterology

## 2017-09-26 NOTE — Telephone Encounter (Signed)
No attempt will be made to do PA for Suprep. Patient can contact office to get alternate prep.

## 2017-09-26 NOTE — Telephone Encounter (Signed)
[facility] calling to state prior auth is required for prep for pt colon on 12.18.18.

## 2017-10-06 ENCOUNTER — Other Ambulatory Visit: Payer: Self-pay

## 2017-10-06 DIAGNOSIS — R790 Abnormal level of blood mineral: Secondary | ICD-10-CM

## 2017-10-06 DIAGNOSIS — R7989 Other specified abnormal findings of blood chemistry: Secondary | ICD-10-CM

## 2017-10-17 ENCOUNTER — Other Ambulatory Visit: Payer: Self-pay | Admitting: Internal Medicine

## 2017-10-17 NOTE — Telephone Encounter (Signed)
Pt is not due for refills until January 2019

## 2017-10-20 ENCOUNTER — Other Ambulatory Visit (INDEPENDENT_AMBULATORY_CARE_PROVIDER_SITE_OTHER): Payer: BLUE CROSS/BLUE SHIELD

## 2017-10-20 DIAGNOSIS — R7989 Other specified abnormal findings of blood chemistry: Secondary | ICD-10-CM | POA: Diagnosis not present

## 2017-10-20 DIAGNOSIS — R790 Abnormal level of blood mineral: Secondary | ICD-10-CM

## 2017-10-20 LAB — BUN: BUN: 21 mg/dL (ref 6–23)

## 2017-10-20 LAB — CREATININE, SERUM: CREATININE: 1.14 mg/dL (ref 0.40–1.50)

## 2017-10-21 ENCOUNTER — Ambulatory Visit (HOSPITAL_COMMUNITY)
Admission: RE | Admit: 2017-10-21 | Discharge: 2017-10-21 | Disposition: A | Discharge status: Home / Self Care | Payer: BLUE CROSS/BLUE SHIELD | Source: Ambulatory Visit | Attending: Gastroenterology | Admitting: Gastroenterology

## 2017-10-21 DIAGNOSIS — R7989 Other specified abnormal findings of blood chemistry: Secondary | ICD-10-CM | POA: Diagnosis not present

## 2017-10-21 DIAGNOSIS — R790 Abnormal level of blood mineral: Secondary | ICD-10-CM | POA: Diagnosis not present

## 2017-10-21 LAB — POCT I-STAT CREATININE: Creatinine, Ser: 1.1 mg/dL (ref 0.61–1.24)

## 2017-10-21 MED ORDER — GADOBENATE DIMEGLUMINE 529 MG/ML IV SOLN
20.0000 mL | Freq: Once | INTRAVENOUS | Status: AC | PRN
  Administered 2017-10-21: 18 mL via INTRAVENOUS

## 2017-11-03 ENCOUNTER — Encounter: Payer: Self-pay | Admitting: Gastroenterology

## 2017-11-07 ENCOUNTER — Telehealth: Payer: Self-pay | Admitting: Gastroenterology

## 2017-11-07 NOTE — Telephone Encounter (Signed)
Spoke with patient and informed him that we do not do PA's for Suprep and we can switch him to [facility] but he will have to come by the office to get new instructions. Pt states he can afford the prep so he will just pick up Suprep but he was just curious if we had a coupon or something to make it cheaper. Informed patient I will fax a coupon to the pharmacy. [facility] and spoke with [facility] and informed patient will pick up Suprep. Also faxed a coupon attention Latoya to [facility].

## 2017-11-11 ENCOUNTER — Ambulatory Visit (AMBULATORY_SURGERY_CENTER): Payer: BLUE CROSS/BLUE SHIELD | Admitting: Gastroenterology

## 2017-11-11 ENCOUNTER — Encounter: Payer: Self-pay | Admitting: Gastroenterology

## 2017-11-11 ENCOUNTER — Other Ambulatory Visit: Payer: Self-pay

## 2017-11-11 VITALS — BP 120/82 | HR 96 | Temp 97.7°F | Resp 13 | Ht 73.0 in | Wt 196.0 lb

## 2017-11-11 DIAGNOSIS — D125 Benign neoplasm of sigmoid colon: Secondary | ICD-10-CM

## 2017-11-11 DIAGNOSIS — D126 Benign neoplasm of colon, unspecified: Secondary | ICD-10-CM

## 2017-11-11 DIAGNOSIS — K635 Polyp of colon: Secondary | ICD-10-CM

## 2017-11-11 DIAGNOSIS — D122 Benign neoplasm of ascending colon: Secondary | ICD-10-CM | POA: Diagnosis not present

## 2017-11-11 DIAGNOSIS — Z86010 Personal history of colonic polyps: Secondary | ICD-10-CM | POA: Diagnosis not present

## 2017-11-11 MED ORDER — SODIUM CHLORIDE 0.9 % IV SOLN: 500.0000 mL | Freq: Once | INTRAVENOUS | Status: DC

## 2017-11-11 NOTE — Patient Instructions (Signed)
YOU HAD AN ENDOSCOPIC PROCEDURE TODAY AT THE [facility]:   Refer to the procedure report that was given to you for any specific questions about what was found during the examination.  If the procedure report does not answer your questions, please call your gastroenterologist to clarify.  If you requested that your care partner not be given the details of your procedure findings, then the procedure report has been included in a sealed envelope for you to review at your convenience later.  YOU SHOULD EXPECT: Some feelings of bloating in the abdomen. Passage of more gas than usual.  Walking can help get rid of the air that was put into your GI tract during the procedure and reduce the bloating. If you had a lower endoscopy (such as a colonoscopy or flexible sigmoidoscopy) you may notice spotting of blood in your stool or on the toilet paper. If you underwent a bowel prep for your procedure, you may not have a normal bowel movement for a few days.  Please Note:  You might notice some irritation and congestion in your nose or some drainage.  This is from the oxygen used during your procedure.  There is no need for concern and it should clear up in a day or so.  SYMPTOMS TO REPORT IMMEDIATELY:   Following lower endoscopy (colonoscopy or flexible sigmoidoscopy):  Excessive amounts of blood in the stool  Significant tenderness or worsening of abdominal pains  Swelling of the abdomen that is new, acute  Fever of 100F or higher   For urgent or emergent issues, a gastroenterologist can be reached at any hour by calling [phone].   DIET:  We do recommend a small meal at first, but then you may proceed to your regular diet.  Drink plenty of fluids but you should avoid alcoholic beverages for 24 hours.  ACTIVITY:  You should plan to take it easy for the rest of today and you should NOT DRIVE or use heavy machinery until tomorrow (because of the sedation medicines used during the test).     FOLLOW UP: Our staff will call the number listed on your records the next business day following your procedure to check on you and address any questions or concerns that you may have regarding the information given to you following your procedure. If we do not reach you, we will leave a message.  However, if you are feeling well and you are not experiencing any problems, there is no need to return our call.  We will assume that you have returned to your regular daily activities without incident.  If any biopsies were taken you will be contacted by phone or by letter within the next 1-3 weeks.  Please call us at [phone] if you have not heard about the biopsies in 3 weeks.    SIGNATURES/CONFIDENTIALITY: You and/or your care partner have signed paperwork which will be entered into your electronic medical record.  These signatures attest to the fact that that the information above on your After Visit Summary has been reviewed and is understood.  Full responsibility of the confidentiality of this discharge information lies with you and/or your care-partner.    Handouts were given to your care partner on polyps and hemorrhoids. You may resume your current medications today. Await biopsy results. Please call if any questions or concerns.   

## 2017-11-11 NOTE — Progress Notes (Signed)
Called to room to assist during endoscopic procedure.  Patient ID and intended procedure confirmed with present staff. Received instructions for my participation in the procedure from the performing physician.  

## 2017-11-11 NOTE — Progress Notes (Signed)
Pt's states no medical or surgical changes since previsit or office visit.Pt's states no medical or surgical changes since previsit or office visit.Pt's states no medical or surgical changes since previsit or office visit.

## 2017-11-11 NOTE — Progress Notes (Signed)
Report given to PACU, vss 

## 2017-11-11 NOTE — Op Note (Signed)
[place] Patient Name: Fred [name] Procedure Date: 11/11/2017 12:58 PM MRN: [id number] Endoscopist: Ladene Artist , MD Age: 56 Referring MD:  Date of Birth: Apr 14, 1961 Gender: Male Account #: [id number] Procedure:                Colonoscopy Indications:              Surveillance: Personal history of adenomatous                            polyps on last colonoscopy > 5 years ago Medicines:                Monitored Anesthesia Care Procedure:                Pre-Anesthesia Assessment:                           - Prior to the procedure, a History and Physical                            was performed, and patient medications and                            allergies were reviewed. The patient's tolerance of                            previous anesthesia was also reviewed. The risks                            and benefits of the procedure and the sedation                            options and risks were discussed with the patient.                            All questions were answered, and informed consent                            was obtained. Prior Anticoagulants: The patient has                            taken no previous anticoagulant or antiplatelet                            agents. ASA Grade Assessment: II - A patient with                            mild systemic disease. After reviewing the risks                            and benefits, the patient was deemed in                            satisfactory condition to undergo the procedure.  After obtaining informed consent, the colonoscope                            was passed under direct vision. Throughout the                            procedure, the patient's blood pressure, pulse, and                            oxygen saturations were monitored continuously. The                            Colonoscope was introduced through the anus and                            advanced to the the  cecum, identified by                            appendiceal orifice and ileocecal valve. The                            ileocecal valve, appendiceal orifice, and rectum                            were photographed. The quality of the bowel                            preparation was excellent. The colonoscopy was                            performed without difficulty. The patient tolerated                            the procedure well. Scope In: 1:11:05 PM Scope Out: 1:24:43 PM Scope Withdrawal Time: 0 hours 11 minutes 7 seconds  Total Procedure Duration: 0 hours 13 minutes 38 seconds  Findings:                 The perianal and digital rectal examinations were                            normal.                           Two sessile polyps were found in the sigmoid colon                            and ascending colon. The polyps were 5 mm in size.                            These polyps were removed with a cold biopsy                            forceps. Resection and retrieval were complete.  Internal hemorrhoids were found during                            retroflexion. The hemorrhoids were small and Grade                            I (internal hemorrhoids that do not prolapse).                           The exam was otherwise without abnormality on                            direct and retroflexion views. Complications:            No immediate complications. Estimated blood loss:                            None. Estimated Blood Loss:     Estimated blood loss: none. Impression:               - Two 5 mm polyps in the sigmoid colon and in the                            ascending colon, removed with a cold biopsy                            forceps. Resected and retrieved.                           - Internal hemorrhoids.                           - The examination was otherwise normal on direct                            and retroflexion views. Recommendation:            - Repeat colonoscopy in 5 years for surveillance.                           - Patient has a contact number available for                            emergencies. The signs and symptoms of potential                            delayed complications were discussed with the                            patient. Return to normal activities tomorrow.                            Written discharge instructions were provided to the                            patient.                           -  Resume previous diet.                           - Continue present medications.                           - Await pathology results. Ladene Artist, MD 11/11/2017 1:27:24 PM This report has been signed electronically.

## 2017-11-11 NOTE — Progress Notes (Signed)
No problems noted in the recovery room. maw 

## 2017-11-12 ENCOUNTER — Telehealth: Payer: Self-pay | Admitting: *Deleted

## 2017-11-12 NOTE — Telephone Encounter (Signed)
  Follow up Call-  Call back number 11/11/2017  Post procedure Call Back phone  # [phone]  Permission to leave phone message Yes  Some recent data might be hidden     Patient questions:  Do you have a fever, pain , or abdominal swelling? No. Pain Score  0 *  Have you tolerated food without any problems? Yes.    Have you been able to return to your normal activities? Yes.    Do you have any questions about your discharge instructions: Diet   No. Medications  No. Follow up visit  No.  Do you have questions or concerns about your Care? No.  Actions: * If pain score is 4 or above: No action needed, pain <4.

## 2017-11-17 ENCOUNTER — Other Ambulatory Visit: Payer: Self-pay | Admitting: Internal Medicine

## 2017-11-21 ENCOUNTER — Encounter: Payer: Self-pay | Admitting: Gastroenterology

## 2017-12-19 ENCOUNTER — Telehealth: Payer: Self-pay

## 2017-12-19 ENCOUNTER — Other Ambulatory Visit (INDEPENDENT_AMBULATORY_CARE_PROVIDER_SITE_OTHER): Payer: BLUE CROSS/BLUE SHIELD

## 2017-12-19 DIAGNOSIS — Z0000 Encounter for general adult medical examination without abnormal findings: Secondary | ICD-10-CM | POA: Diagnosis not present

## 2017-12-19 LAB — BASIC METABOLIC PANEL WITH GFR
BUN: 18 mg/dL (ref 6–23)
CO2: 31 meq/L (ref 19–32)
Calcium: 9.6 mg/dL (ref 8.4–10.5)
Chloride: 101 meq/L (ref 96–112)
Creatinine, Ser: 1.08 mg/dL (ref 0.40–1.50)
GFR: 74.82 mL/min
Glucose, Bld: 98 mg/dL (ref 70–99)
Potassium: 4 meq/L (ref 3.5–5.1)
Sodium: 140 meq/L (ref 135–145)

## 2017-12-19 LAB — LIPID PANEL
CHOL/HDL RATIO: 4
Cholesterol: 141 mg/dL (ref 0–200)
HDL: 39 mg/dL — ABNORMAL LOW (ref >39.00)
NONHDL: 102.37
Triglycerides: 203 mg/dL — ABNORMAL HIGH (ref 0.0–149.0)
VLDL: 40.6 mg/dL — AB (ref 0.0–40.0)

## 2017-12-19 LAB — CBC WITH DIFFERENTIAL/PLATELET
BASOS ABS: 0 10*3/uL (ref 0.0–0.1)
Basophils Relative: 0.5 % (ref 0.0–3.0)
EOS ABS: 0.1 10*3/uL (ref 0.0–0.7)
Eosinophils Relative: 1.6 % (ref 0.0–5.0)
HEMATOCRIT: 42.6 % (ref 39.0–52.0)
HEMOGLOBIN: 14.6 g/dL (ref 13.0–17.0)
LYMPHS PCT: 30.3 % (ref 12.0–46.0)
Lymphs Abs: 2.1 10*3/uL (ref 0.7–4.0)
MCHC: 34.3 g/dL (ref 30.0–36.0)
MCV: 93.2 fl (ref 78.0–100.0)
Monocytes Absolute: 0.5 10*3/uL (ref 0.1–1.0)
Monocytes Relative: 6.9 % (ref 3.0–12.0)
Neutro Abs: 4.3 10*3/uL (ref 1.4–7.7)
Neutrophils Relative %: 60.7 % (ref 43.0–77.0)
Platelets: 292 10*3/uL (ref 150.0–400.0)
RBC: 4.58 Mil/uL (ref 4.22–5.81)
RDW: 14.4 % (ref 11.5–15.5)
WBC: 7 10*3/uL (ref 4.0–10.5)

## 2017-12-19 LAB — HEPATIC FUNCTION PANEL
ALBUMIN: 4.1 g/dL (ref 3.5–5.2)
ALK PHOS: 68 U/L (ref 39–117)
ALT: 34 U/L (ref 0–53)
AST: 20 U/L (ref 0–37)
BILIRUBIN DIRECT: 0.1 mg/dL (ref 0.0–0.3)
BILIRUBIN TOTAL: 0.5 mg/dL (ref 0.2–1.2)
Total Protein: 6.4 g/dL (ref 6.0–8.3)

## 2017-12-19 LAB — PSA: PSA: 11.07 ng/mL — ABNORMAL HIGH (ref 0.10–4.00)

## 2017-12-19 LAB — URINALYSIS, ROUTINE W REFLEX MICROSCOPIC
Bilirubin Urine: NEGATIVE
HGB URINE DIPSTICK: NEGATIVE
Ketones, ur: NEGATIVE
Nitrite: NEGATIVE
Specific Gravity, Urine: 1.025 (ref 1.000–1.030)
TOTAL PROTEIN, URINE-UPE24: NEGATIVE
Urine Glucose: NEGATIVE
Urobilinogen, UA: 0.2 (ref 0.0–1.0)
pH: 6 (ref 5.0–8.0)

## 2017-12-19 LAB — TSH: TSH: 2.83 u[IU]/mL (ref 0.35–4.50)

## 2017-12-19 LAB — LDL CHOLESTEROL, DIRECT: Direct LDL: 76 mg/dL

## 2017-12-19 NOTE — Telephone Encounter (Signed)
CPE lab needed to be entered

## 2017-12-24 ENCOUNTER — Ambulatory Visit (INDEPENDENT_AMBULATORY_CARE_PROVIDER_SITE_OTHER): Payer: BLUE CROSS/BLUE SHIELD | Admitting: Internal Medicine

## 2017-12-24 ENCOUNTER — Encounter: Payer: Self-pay | Admitting: Internal Medicine

## 2017-12-24 VITALS — BP 148/98 | HR 92 | Temp 98.0°F | Ht 73.0 in | Wt 200.0 lb

## 2017-12-24 DIAGNOSIS — Z0000 Encounter for general adult medical examination without abnormal findings: Secondary | ICD-10-CM | POA: Diagnosis not present

## 2017-12-24 DIAGNOSIS — I10 Essential (primary) hypertension: Secondary | ICD-10-CM | POA: Diagnosis not present

## 2017-12-24 DIAGNOSIS — R972 Elevated prostate specific antigen [PSA]: Secondary | ICD-10-CM | POA: Diagnosis not present

## 2017-12-24 DIAGNOSIS — R739 Hyperglycemia, unspecified: Secondary | ICD-10-CM | POA: Insufficient documentation

## 2017-12-24 MED ORDER — MIRTAZAPINE 45 MG PO TABS: 45.0000 mg | ORAL_TABLET | Freq: Every day | ORAL | 3 refills | Status: DC

## 2017-12-24 MED ORDER — BUPROPION HCL ER (XL) 300 MG PO TB24: 300.0000 mg | ORAL_TABLET | Freq: Every day | ORAL | 3 refills | Status: DC

## 2017-12-24 MED ORDER — OLMESARTAN MEDOXOMIL-HCTZ 40-12.5 MG PO TABS: 1.0000 | ORAL_TABLET | Freq: Every day | ORAL | 3 refills | Status: DC

## 2017-12-24 MED ORDER — ATORVASTATIN CALCIUM 40 MG PO TABS: 40.0000 mg | ORAL_TABLET | Freq: Every day | ORAL | 3 refills | Status: DC

## 2017-12-24 NOTE — Assessment & Plan Note (Signed)
Also for a1c with labs, to work on diet, wt control and excercise

## 2017-12-24 NOTE — Progress Notes (Signed)
Subjective:    Patient ID: Fred [name], male    DOB: 09/22/1961, 57 y.o.   MRN: [id number]  HPI  Here for wellness and f/u;  Overall doing ok;  Pt denies Chest pain, worsening SOB, DOE, wheezing, orthopnea, PND, worsening LE edema, palpitations, dizziness or syncope.  Pt denies neurological change such as new headache, facial or extremity weakness.  Pt denies polydipsia, polyuria, or low sugar symptoms. Pt states overall good compliance with treatment and medications, good tolerability,  Pt denies worsening depressive symptoms, suicidal ideation or panic. No fever, night sweats, wt loss, loss of appetite, or other constitutional symptoms.  Pt states good ability with ADL's, has low fall risk, home safety reviewed and adequate, no other significant changes in hearing or vision, and only occasionally active with exercise, less so and not watching diet well in recent months over the holidays.    Past Medical History:  Diagnosis Date  . ALCOHOL ABUSE, EPISODIC, HX OF 02/15/2008  . ALLERGIC RHINITIS 02/15/2008  . DEPRESSION 07/28/2007  . HEARING LOSS 08/05/2008  . HYPERLIPIDEMIA 02/11/2008  . HYPERTENSION 07/28/2007  . PSA, INCREASED 10/01/2010  . TRANSAMINASES, SERUM, ELEVATED 09/01/2009  . Tubular adenoma of colon 10/2010   Past Surgical History:  Procedure Laterality Date  . COLONOSCOPY    . inguinal herniorrhapy  1976  . KNEE ARTHROSCOPY, MEDIAL PATELLO FEMORAL LIGAMENT RECONSTRUCTION W/ HAMSTRING GRAFT Left 2012   They cleaned cartilege out of my knee  . left foot surgery  08/2007  . POLYPECTOMY      reports that  has never smoked. he has never used smokeless tobacco. He reports that he does not drink alcohol or use drugs. family history includes Alcohol abuse in his mother; Colon polyps in his maternal grandmother; Depression in his mother; Hypertension in his father; Prostate cancer in his father. No Known Allergies Current Outpatient Medications on File Prior to Visit  Medication Sig  Dispense Refill  . aspirin EC 81 MG tablet Take 1 tablet (81 mg total) by mouth daily. 90 tablet 11  . dicyclomine (BENTYL) 10 MG capsule Take 1 capsule (10 mg total) by mouth 3 (three) times daily before meals. 90 capsule 11  . irbesartan-hydrochlorothiazide (AVALIDE) 150-12.5 MG tablet Take 1 tablet by mouth daily. 90 tablet 3  . loratadine (CLARITIN) 10 MG tablet Take 10 mg by mouth daily.     No current facility-administered medications on file prior to visit.    Review of Systems Constitutional: Negative for other unusual diaphoresis, sweats, appetite or weight changes HENT: Negative for other worsening hearing loss, ear pain, facial swelling, mouth sores or neck stiffness.   Eyes: Negative for other worsening pain, redness or other visual disturbance.  Respiratory: Negative for other stridor or swelling Cardiovascular: Negative for other palpitations or other chest pain  Gastrointestinal: Negative for worsening diarrhea or loose stools, blood in stool, distention or other pain Genitourinary: Negative for hematuria, flank pain or other change in urine volume.  Musculoskeletal: Negative for myalgias or other joint swelling.  Skin: Negative for other color change, or other wound or worsening drainage.  Neurological: Negative for other syncope or numbness. Hematological: Negative for other adenopathy or swelling Psychiatric/Behavioral: Negative for hallucinations, other worsening agitation, SI, self-injury, or new decreased concentration All other system neg per pt    Objective:   Physical Exam BP (!) 148/98   Pulse 92   Temp 98 F (36.7 C) (Oral)   Ht 6\' 1"  (1.854 m)   Wt 200 lb (  90.7 kg)   SpO2 98%   BMI 26.39 kg/m  VS noted,  Constitutional: Pt is oriented to person, place, and time. Appears well-developed and well-nourished, in no significant distress and comfortable Head: Normocephalic and atraumatic  Eyes: Conjunctivae and EOM are normal. Pupils are equal, round, and  reactive to light Right Ear: External ear normal without discharge Left Ear: External ear normal without discharge Nose: Nose without discharge or deformity Mouth/Throat: Oropharynx is without other ulcerations and moist  Neck: Normal range of motion. Neck supple. No JVD present. No tracheal deviation present or significant neck LA or mass Cardiovascular: Normal rate, regular rhythm, normal heart sounds and intact distal pulses.   Pulmonary/Chest: WOB normal and breath sounds without rales or wheezing  Abdominal: Soft. Bowel sounds are normal. NT. No HSM  Musculoskeletal: Normal range of motion. Exhibits no edema Lymphadenopathy: Has no other cervical adenopathy.  Neurological: Pt is alert and oriented to person, place, and time. Pt has normal reflexes. No cranial nerve deficit. Motor grossly intact, Gait intact Skin: Skin is warm and dry. No rash noted or new ulcerations Psychiatric:  Has normal mood and affect. Behavior is normal without agitation No other exam findings Lab Results  Component Value Date   WBC 7.0 12/19/2017   HGB 14.6 12/19/2017   HCT 42.6 12/19/2017   PLT 292.0 12/19/2017   GLUCOSE 98 12/19/2017   CHOL 141 12/19/2017   TRIG 203.0 (H) 12/19/2017   HDL 39.00 (L) 12/19/2017   LDLDIRECT 76.0 12/19/2017   LDLCALC 74 03/06/2017   ALT 34 12/19/2017   AST 20 12/19/2017   NA 140 12/19/2017   K 4.0 12/19/2017   CL 101 12/19/2017   CREATININE 1.08 12/19/2017   BUN 18 12/19/2017   CO2 31 12/19/2017   TSH 2.83 12/19/2017   PSA 11.07 (H) 12/19/2017   INR 1.0 08/07/2017   HGBA1C 5.4 03/06/2017       Assessment & Plan:

## 2017-12-24 NOTE — Assessment & Plan Note (Signed)
S/p prostate bx x 2 and neg MRI  - pt no longer wants to follow with urology, will check f/u psa at 6 mo, then 1 yr to assess trend

## 2017-12-24 NOTE — Patient Instructions (Signed)
OK to change the Avalide to Benicar HCT (generic)  Please call or mychart message in 1 -2 wks if your Blood Pressure is still elevated, to consider adding a second prescription  Please continue all other medications as before, and refills have been done if requested.  Please have the pharmacy call with any other refills you may need.  Please continue your efforts at being more active, low cholesterol diet, and weight control.  You are otherwise up to date with prevention measures today.  Please keep your appointments with your specialists as you may have planned  Please go to the LAB in the Basement (turn left off the elevator) for the tests to be done IN 6 MONTHS for follow up PSA  You will be contacted by phone if any changes need to be made immediately.  Otherwise, you will receive a letter about your results with an explanation, but please check with MyChart first.  Please remember to sign up for MyChart if you have not done so, as this will be important to you in the future with finding out test results, communicating by private email, and scheduling acute appointments online when needed.  Please return in 1 year for your yearly visit, or sooner if needed, with Lab testing done 3-5 days before

## 2017-12-24 NOTE — Assessment & Plan Note (Signed)
avalide has been recalled, so will change to benicar HCt 40/12.5 qd, to f/u BP at home

## 2017-12-24 NOTE — Assessment & Plan Note (Signed)

## 2018-04-16 ENCOUNTER — Other Ambulatory Visit (INDEPENDENT_AMBULATORY_CARE_PROVIDER_SITE_OTHER): Payer: BLUE CROSS/BLUE SHIELD

## 2018-04-16 ENCOUNTER — Encounter: Payer: Self-pay | Admitting: Internal Medicine

## 2018-04-16 ENCOUNTER — Ambulatory Visit: Payer: BLUE CROSS/BLUE SHIELD | Admitting: Internal Medicine

## 2018-04-16 VITALS — BP 170/116 | HR 94 | Temp 98.0°F | Ht 73.0 in | Wt 198.0 lb

## 2018-04-16 DIAGNOSIS — N529 Male erectile dysfunction, unspecified: Secondary | ICD-10-CM

## 2018-04-16 DIAGNOSIS — Z114 Encounter for screening for human immunodeficiency virus [HIV]: Secondary | ICD-10-CM

## 2018-04-16 DIAGNOSIS — I10 Essential (primary) hypertension: Secondary | ICD-10-CM

## 2018-04-16 LAB — TESTOSTERONE: TESTOSTERONE: 361.97 ng/dL (ref 300.00–890.00)

## 2018-04-16 MED ORDER — AMLODIPINE BESY-BENAZEPRIL HCL 5-40 MG PO CAPS: 1.0000 | ORAL_CAPSULE | Freq: Every day | ORAL | 3 refills | Status: DC

## 2018-04-16 MED ORDER — SILDENAFIL CITRATE 100 MG PO TABS: 50.0000 mg | ORAL_TABLET | Freq: Every day | ORAL | 11 refills | Status: DC | PRN

## 2018-04-16 NOTE — Assessment & Plan Note (Addendum)
Most likely related to HCT; for med change as above, also for viagra prn but may not need

## 2018-04-16 NOTE — Patient Instructions (Signed)
Ok to stop the benicar HCT  Please take all new medication as prescribed - the amloipdine/olmesartan  - 1 pill in the AM  Please take all new medication as prescribed - the viagra if needed  Please continue all other medications as before, and refills have been done if requested.  Please have the pharmacy call with any other refills you may need.  Please keep your appointments with your specialists as you may have planned  Please go to the LAB in the Basement (turn left off the elevator) for the tests to be done today  You will be contacted by phone if any changes need to be made immediately.  Otherwise, you will receive a letter about your results with an explanation, but please check with MyChart first.  Please remember to sign up for MyChart if you have not done so, as this will be important to you in the future with finding out test results, communicating by private email, and scheduling acute appointments online when needed.

## 2018-04-16 NOTE — Progress Notes (Signed)
Subjective:    Patient ID: Fred [name], male    DOB: [birth year], 57 y.o.   MRN: [id number]  HPI  Here to f/u with HTN and new ED; BP has been 142/90 at home this am, only mild high this wk after flui like illness now improved  Does have c/o ED with the benicar hct, did happen to try the irbesartan he before and ED cleared after 5 days. Restarted the benicar hct with same effect.  Does incidentally have improving URI symptoms but does not think related  Also started new relationship, and is asking for HIV testing per partner request Past Medical History:  Diagnosis Date  . ALCOHOL ABUSE, EPISODIC, HX OF 02/15/2008  . ALLERGIC RHINITIS 02/15/2008  . DEPRESSION 07/28/2007  . HEARING LOSS 08/05/2008  . HYPERLIPIDEMIA 02/11/2008  . HYPERTENSION 07/28/2007  . PSA, INCREASED 10/01/2010  . TRANSAMINASES, SERUM, ELEVATED 09/01/2009  . Tubular adenoma of colon 10/2010   Past Surgical History:  Procedure Laterality Date  . COLONOSCOPY    . inguinal herniorrhapy  1976  . KNEE ARTHROSCOPY, MEDIAL PATELLO FEMORAL LIGAMENT RECONSTRUCTION W/ HAMSTRING GRAFT Left 2012   They cleaned cartilege out of my knee  . left foot surgery  08/2007  . POLYPECTOMY      reports that he has never smoked. He has never used smokeless tobacco. He reports that he does not drink alcohol or use drugs. family history includes Alcohol abuse in his mother; Colon polyps in his maternal grandmother; Depression in his mother; Hypertension in his father; Prostate cancer in his father. No Known Allergies Current Outpatient Medications on File Prior to Visit  Medication Sig Dispense Refill  . aspirin EC 81 MG tablet Take 1 tablet (81 mg total) by mouth daily. 90 tablet 11  . atorvastatin (LIPITOR) 40 MG tablet Take 1 tablet (40 mg total) by mouth daily. 90 tablet 3  . buPROPion (WELLBUTRIN XL) 300 MG 24 hr tablet Take 1 tablet (300 mg total) by mouth daily. 90 tablet 3  . dicyclomine (BENTYL) 10 MG capsule Take 1 capsule (10 mg  total) by mouth 3 (three) times daily before meals. 90 capsule 11  . loratadine (CLARITIN) 10 MG tablet Take 10 mg by mouth daily.    . mirtazapine (REMERON) 45 MG tablet Take 1 tablet (45 mg total) by mouth at bedtime. 90 tablet 3  . olmesartan-hydrochlorothiazide (BENICAR HCT) 40-12.5 MG tablet Take 1 tablet by mouth daily. 90 tablet 3   No current facility-administered medications on file prior to visit.    Review of Systems  Constitutional: Negative for other unusual diaphoresis or sweats HENT: Negative for ear discharge or swelling Eyes: Negative for other worsening visual disturbances Respiratory: Negative for stridor or other swelling  Gastrointestinal: Negative for worsening distension or other blood Genitourinary: Negative for retention or other urinary change Musculoskeletal: Negative for other MSK pain or swelling Skin: Negative for color change or other new lesions Neurological: Negative for worsening tremors and other numbness  Psychiatric/Behavioral: Negative for worsening agitation or other fatigue All other system neg per pt    Objective:   Physical Exam BP (!) 170/116   Pulse 94   Temp 98 F (36.7 C) (Oral)   Ht 6\' 1"  (1.854 m)   Wt 198 lb (89.8 kg)   SpO2 97%   BMI 26.12 kg/m  VS noted,  Constitutional: Pt appears in NAD HENT: Head: NCAT.  Right Ear: External ear normal.  Left Ear: External ear normal.  Bilat tm's  with mild erythema.  Max sinus areas non tender.  Pharynx with mild erythema, no exudate  Eyes: . Pupils are equal, round, and reactive to light. Conjunctivae and EOM are normal Nose: without d/c or deformity Neck: Neck supple. Gross normal ROM Cardiovascular: Normal rate and regular rhythm.   Pulmonary/Chest: Effort normal and breath sounds without rales or wheezing.  Abd:  Soft, NT, ND, + BS, no organomegaly Neurological: Pt is alert. At baseline orientation, motor grossly intact Skin: Skin is warm. No rashes, other new lesions, no LE  edema Psychiatric: Pt behavior is normal without agitation  No other exam findings    Assessment & Plan:

## 2018-04-16 NOTE — Assessment & Plan Note (Signed)
For HIV screen test per pt request

## 2018-04-16 NOTE — Assessment & Plan Note (Signed)
Ok for change benicar hct to amlod-benicar 5/40 qd,  to f/u any worsening symptoms or concerns

## 2018-04-17 LAB — HIV ANTIBODY (ROUTINE TESTING W REFLEX): HIV 1&2 Ab, 4th Generation: NONREACTIVE

## 2018-07-22 DIAGNOSIS — L738 Other specified follicular disorders: Secondary | ICD-10-CM | POA: Diagnosis not present

## 2018-07-22 DIAGNOSIS — Z85828 Personal history of other malignant neoplasm of skin: Secondary | ICD-10-CM | POA: Diagnosis not present

## 2018-07-22 DIAGNOSIS — L570 Actinic keratosis: Secondary | ICD-10-CM | POA: Diagnosis not present

## 2018-07-31 ENCOUNTER — Other Ambulatory Visit (INDEPENDENT_AMBULATORY_CARE_PROVIDER_SITE_OTHER): Payer: BLUE CROSS/BLUE SHIELD

## 2018-07-31 ENCOUNTER — Telehealth: Payer: Self-pay

## 2018-07-31 ENCOUNTER — Other Ambulatory Visit: Payer: Self-pay | Admitting: Internal Medicine

## 2018-07-31 DIAGNOSIS — R972 Elevated prostate specific antigen [PSA]: Secondary | ICD-10-CM

## 2018-07-31 DIAGNOSIS — I10 Essential (primary) hypertension: Secondary | ICD-10-CM | POA: Diagnosis not present

## 2018-07-31 DIAGNOSIS — R739 Hyperglycemia, unspecified: Secondary | ICD-10-CM

## 2018-07-31 LAB — BASIC METABOLIC PANEL
BUN: 20 mg/dL (ref 6–23)
CALCIUM: 9.4 mg/dL (ref 8.4–10.5)
CHLORIDE: 103 meq/L (ref 96–112)
CO2: 29 mEq/L (ref 19–32)
CREATININE: 0.99 mg/dL (ref 0.40–1.50)
GFR: 82.54 mL/min (ref >60.00)
Glucose, Bld: 105 mg/dL — ABNORMAL HIGH (ref 70–99)
Potassium: 4.1 mEq/L (ref 3.5–5.1)
Sodium: 139 mEq/L (ref 135–145)

## 2018-07-31 LAB — CBC WITH DIFFERENTIAL/PLATELET
BASOS ABS: 0 10*3/uL (ref 0.0–0.1)
Basophils Relative: 0.5 % (ref 0.0–3.0)
EOS ABS: 0.1 10*3/uL (ref 0.0–0.7)
Eosinophils Relative: 1.8 % (ref 0.0–5.0)
HEMATOCRIT: 42.3 % (ref 39.0–52.0)
HEMOGLOBIN: 14.6 g/dL (ref 13.0–17.0)
LYMPHS PCT: 25.9 % (ref 12.0–46.0)
Lymphs Abs: 2 10*3/uL (ref 0.7–4.0)
MCHC: 34.5 g/dL (ref 30.0–36.0)
MCV: 93.7 fl (ref 78.0–100.0)
Monocytes Absolute: 0.4 10*3/uL (ref 0.1–1.0)
Monocytes Relative: 5.7 % (ref 3.0–12.0)
NEUTROS ABS: 5.2 10*3/uL (ref 1.4–7.7)
Neutrophils Relative %: 66.1 % (ref 43.0–77.0)
Platelets: 238 10*3/uL (ref 150.0–400.0)
RBC: 4.51 Mil/uL (ref 4.22–5.81)
RDW: 13.9 % (ref 11.5–15.5)
WBC: 7.8 10*3/uL (ref 4.0–10.5)

## 2018-07-31 LAB — LIPID PANEL
CHOLESTEROL: 164 mg/dL (ref 0–200)
HDL: 48.7 mg/dL (ref >39.00)
LDL CALC: 75 mg/dL (ref 0–99)
NonHDL: 115.03
TRIGLYCERIDES: 200 mg/dL — AB (ref 0.0–149.0)
Total CHOL/HDL Ratio: 3
VLDL: 40 mg/dL (ref 0.0–40.0)

## 2018-07-31 LAB — HEPATIC FUNCTION PANEL
ALBUMIN: 4.2 g/dL (ref 3.5–5.2)
ALK PHOS: 74 U/L (ref 39–117)
ALT: 50 U/L (ref 0–53)
AST: 42 U/L — AB (ref 0–37)
BILIRUBIN DIRECT: 0.1 mg/dL (ref 0.0–0.3)
TOTAL PROTEIN: 6.5 g/dL (ref 6.0–8.3)
Total Bilirubin: 0.7 mg/dL (ref 0.2–1.2)

## 2018-07-31 LAB — URINALYSIS, ROUTINE W REFLEX MICROSCOPIC
Bilirubin Urine: NEGATIVE
Hgb urine dipstick: NEGATIVE
Ketones, ur: NEGATIVE
Leukocytes, UA: NEGATIVE
Nitrite: NEGATIVE
PH: 6 (ref 5.0–8.0)
RBC / HPF: NONE SEEN (ref 0–?)
SPECIFIC GRAVITY, URINE: 1.02 (ref 1.000–1.030)
Total Protein, Urine: NEGATIVE
Urine Glucose: NEGATIVE
Urobilinogen, UA: 0.2 (ref 0.0–1.0)

## 2018-07-31 LAB — HEMOGLOBIN A1C: HEMOGLOBIN A1C: 5.4 % (ref 4.6–6.5)

## 2018-07-31 LAB — TSH: TSH: 4.08 u[IU]/mL (ref 0.35–4.50)

## 2018-07-31 LAB — PSA: PSA: 14.62 ng/mL — AB (ref 0.10–4.00)

## 2018-07-31 NOTE — Telephone Encounter (Signed)
-----   Message from Biagio Borg, MD sent at 07/31/2018 12:34 PM EDT ----- See below

## 2018-07-31 NOTE — Telephone Encounter (Signed)
Called pt, LVM.   CRM created.  

## 2018-09-14 ENCOUNTER — Other Ambulatory Visit: Payer: Self-pay | Admitting: Internal Medicine

## 2018-09-25 ENCOUNTER — Encounter: Payer: Self-pay | Admitting: Internal Medicine

## 2018-09-28 ENCOUNTER — Other Ambulatory Visit: Payer: Self-pay | Admitting: Internal Medicine

## 2018-10-19 ENCOUNTER — Other Ambulatory Visit: Payer: Self-pay | Admitting: Internal Medicine

## 2018-10-19 ENCOUNTER — Telehealth: Payer: Self-pay | Admitting: Internal Medicine

## 2018-10-19 MED ORDER — BENAZEPRIL HCL 40 MG PO TABS: 40.0000 mg | ORAL_TABLET | Freq: Every day | ORAL | 3 refills | Status: DC

## 2018-10-19 MED ORDER — AMLODIPINE BESYLATE 5 MG PO TABS: 5.0000 mg | ORAL_TABLET | Freq: Every day | ORAL | 3 refills | Status: DC

## 2018-10-19 NOTE — Addendum Note (Signed)
Addended by: Biagio Borg on: 10/19/2018 05:28 PM   Modules accepted: Orders

## 2018-10-19 NOTE — Telephone Encounter (Signed)
Copied from [place] [id number]. Topic: General - Other >> Oct 19, 2018 12:15 PM Janace Aris A wrote:  Reason for CRM: [place] called in wanting to inquire if the provider was aware that the pt was taking amLODipine-benazepril (LOTREL) 5-40 MG capsule, she says they are two BP medications and she just wanted to make sure before she fills it.

## 2018-10-19 NOTE — Telephone Encounter (Signed)
Ok for rx for each med due to backorder  No need to take benicar hct when taking the above

## 2018-10-20 NOTE — Telephone Encounter (Signed)
Pharm is calling needing clarification of bp medications

## 2018-10-20 NOTE — Telephone Encounter (Signed)
Pt has been informed and expressed understanding.  

## 2018-10-20 NOTE — Telephone Encounter (Signed)
Pharmacist has been informed

## 2018-10-29 DIAGNOSIS — R972 Elevated prostate specific antigen [PSA]: Secondary | ICD-10-CM | POA: Diagnosis not present

## 2018-10-29 DIAGNOSIS — Z125 Encounter for screening for malignant neoplasm of prostate: Secondary | ICD-10-CM | POA: Diagnosis not present

## 2018-12-17 ENCOUNTER — Telehealth: Payer: Self-pay

## 2018-12-17 ENCOUNTER — Other Ambulatory Visit (INDEPENDENT_AMBULATORY_CARE_PROVIDER_SITE_OTHER): Payer: BLUE CROSS/BLUE SHIELD

## 2018-12-17 DIAGNOSIS — Z0000 Encounter for general adult medical examination without abnormal findings: Secondary | ICD-10-CM

## 2018-12-17 DIAGNOSIS — Z125 Encounter for screening for malignant neoplasm of prostate: Secondary | ICD-10-CM

## 2018-12-17 LAB — URINALYSIS, ROUTINE W REFLEX MICROSCOPIC
Bilirubin Urine: NEGATIVE
Hgb urine dipstick: NEGATIVE
Ketones, ur: NEGATIVE
Leukocytes, UA: NEGATIVE
Nitrite: NEGATIVE
RBC / HPF: NONE SEEN (ref 0–?)
Specific Gravity, Urine: 1.025 (ref 1.000–1.030)
Total Protein, Urine: NEGATIVE
Urine Glucose: NEGATIVE
Urobilinogen, UA: 0.2 (ref 0.0–1.0)
WBC, UA: NONE SEEN (ref 0–?)
pH: 5.5 (ref 5.0–8.0)

## 2018-12-17 LAB — HEPATIC FUNCTION PANEL
ALT: 51 U/L (ref 0–53)
AST: 25 U/L (ref 0–37)
Albumin: 4.1 g/dL (ref 3.5–5.2)
Alkaline Phosphatase: 66 U/L (ref 39–117)
Bilirubin, Direct: 0.1 mg/dL (ref 0.0–0.3)
Total Bilirubin: 0.5 mg/dL (ref 0.2–1.2)
Total Protein: 6.2 g/dL (ref 6.0–8.3)

## 2018-12-17 LAB — CBC WITH DIFFERENTIAL/PLATELET
BASOS ABS: 0.1 10*3/uL (ref 0.0–0.1)
Basophils Relative: 0.8 % (ref 0.0–3.0)
Eosinophils Absolute: 0.1 10*3/uL (ref 0.0–0.7)
Eosinophils Relative: 2.1 % (ref 0.0–5.0)
HCT: 42.2 % (ref 39.0–52.0)
Hemoglobin: 14.6 g/dL (ref 13.0–17.0)
Lymphocytes Relative: 28.9 % (ref 12.0–46.0)
Lymphs Abs: 2.1 10*3/uL (ref 0.7–4.0)
MCHC: 34.6 g/dL (ref 30.0–36.0)
MCV: 92.7 fl (ref 78.0–100.0)
Monocytes Absolute: 0.6 10*3/uL (ref 0.1–1.0)
Monocytes Relative: 7.8 % (ref 3.0–12.0)
Neutro Abs: 4.3 10*3/uL (ref 1.4–7.7)
Neutrophils Relative %: 60.4 % (ref 43.0–77.0)
Platelets: 284 10*3/uL (ref 150.0–400.0)
RBC: 4.55 Mil/uL (ref 4.22–5.81)
RDW: 14.1 % (ref 11.5–15.5)
WBC: 7.1 10*3/uL (ref 4.0–10.5)

## 2018-12-17 LAB — BASIC METABOLIC PANEL
BUN: 12 mg/dL (ref 6–23)
CO2: 28 meq/L (ref 19–32)
Calcium: 9.5 mg/dL (ref 8.4–10.5)
Chloride: 106 mEq/L (ref 96–112)
Creatinine, Ser: 1.15 mg/dL (ref 0.40–1.50)
GFR: 65.24 mL/min (ref >60.00)
Glucose, Bld: 107 mg/dL — ABNORMAL HIGH (ref 70–99)
POTASSIUM: 4.3 meq/L (ref 3.5–5.1)
Sodium: 141 mEq/L (ref 135–145)

## 2018-12-17 LAB — LIPID PANEL
CHOL/HDL RATIO: 4
Cholesterol: 157 mg/dL (ref 0–200)
HDL: 44.9 mg/dL (ref >39.00)
LDL Cholesterol: 90 mg/dL (ref 0–99)
NonHDL: 112.34
Triglycerides: 110 mg/dL (ref 0.0–149.0)
VLDL: 22 mg/dL (ref 0.0–40.0)

## 2018-12-17 LAB — TSH: TSH: 3.37 u[IU]/mL (ref 0.35–4.50)

## 2018-12-17 LAB — PSA: PSA: 9.64 ng/mL — ABNORMAL HIGH (ref 0.10–4.00)

## 2018-12-17 NOTE — Telephone Encounter (Signed)
Labs for upcoming CPE needed to entered

## 2018-12-25 ENCOUNTER — Ambulatory Visit (INDEPENDENT_AMBULATORY_CARE_PROVIDER_SITE_OTHER): Payer: BLUE CROSS/BLUE SHIELD | Admitting: Internal Medicine

## 2018-12-25 ENCOUNTER — Encounter: Payer: Self-pay | Admitting: Internal Medicine

## 2018-12-25 VITALS — BP 126/84 | HR 89 | Temp 98.4°F | Ht 73.0 in | Wt 195.0 lb

## 2018-12-25 DIAGNOSIS — I10 Essential (primary) hypertension: Secondary | ICD-10-CM

## 2018-12-25 DIAGNOSIS — R739 Hyperglycemia, unspecified: Secondary | ICD-10-CM | POA: Diagnosis not present

## 2018-12-25 DIAGNOSIS — Z0000 Encounter for general adult medical examination without abnormal findings: Secondary | ICD-10-CM | POA: Diagnosis not present

## 2018-12-25 LAB — POCT GLYCOSYLATED HEMOGLOBIN (HGB A1C): Hemoglobin A1C: 5.3 % (ref 4.0–5.6)

## 2018-12-25 MED ORDER — AMLODIPINE BESYLATE 5 MG PO TABS: 5.0000 mg | ORAL_TABLET | Freq: Every day | ORAL | 3 refills | Status: DC

## 2018-12-25 MED ORDER — BUPROPION HCL ER (XL) 300 MG PO TB24: 300.0000 mg | ORAL_TABLET | Freq: Every day | ORAL | 3 refills | Status: DC

## 2018-12-25 MED ORDER — MIRTAZAPINE 45 MG PO TABS: 45.0000 mg | ORAL_TABLET | Freq: Every day | ORAL | 3 refills | Status: DC

## 2018-12-25 MED ORDER — ATORVASTATIN CALCIUM 40 MG PO TABS: 40.0000 mg | ORAL_TABLET | Freq: Every day | ORAL | 3 refills | Status: DC

## 2018-12-25 MED ORDER — SILDENAFIL CITRATE 100 MG PO TABS: 50.0000 mg | ORAL_TABLET | Freq: Every day | ORAL | 11 refills | Status: DC | PRN

## 2018-12-25 MED ORDER — BENAZEPRIL HCL 40 MG PO TABS: 40.0000 mg | ORAL_TABLET | Freq: Every day | ORAL | 3 refills | Status: DC

## 2018-12-25 NOTE — Patient Instructions (Signed)
Your A1c was OK today  Please continue all other medications as before, and refills have been done if requested.  Please have the pharmacy call with any other refills you may need.  Please continue your efforts at being more active, low cholesterol diet, and weight control.  You are otherwise up to date with prevention measures today.  Please keep your appointments with your specialists as you may have planned  Please return in 1 year for your yearly visit, or sooner if needed, with Lab testing done 3-5 days before

## 2018-12-25 NOTE — Progress Notes (Addendum)
Subjective:    Patient ID: Fred [name], male    DOB: 12-17-1960, 58 y.o.   MRN: [id number]  HPI  Here for wellness and f/u;  Overall doing ok;  Pt denies Chest pain, worsening SOB, DOE, wheezing, orthopnea, PND, worsening LE edema, palpitations, dizziness or syncope.  Pt denies neurological change such as new headache, facial or extremity weakness.  Pt denies polydipsia, polyuria, or low sugar symptoms. Pt states overall good compliance with treatment and medications, good tolerability, and has been trying to follow appropriate diet.  Pt denies worsening depressive symptoms, suicidal ideation or panic. No fever, night sweats, wt loss, loss of appetite, or other constitutional symptoms.  Pt states good ability with ADL's, has low fall risk, home safety reviewed and adequate, no other significant changes in hearing or vision, and only occasionally active with exercise. BP Readings from Last 3 Encounters:  12/25/18 126/84  04/16/18 (!) 170/116  12/24/17 (!) 148/98   Wt Readings from Last 3 Encounters:  12/25/18 195 lb (88.5 kg)  04/16/18 198 lb (89.8 kg)  12/24/17 200 lb (90.7 kg)   Past Medical History:  Diagnosis Date  . ALCOHOL ABUSE, EPISODIC, HX OF 02/15/2008  . ALLERGIC RHINITIS 02/15/2008  . DEPRESSION 07/28/2007  . HEARING LOSS 08/05/2008  . HYPERLIPIDEMIA 02/11/2008  . HYPERTENSION 07/28/2007  . PSA, INCREASED 10/01/2010  . TRANSAMINASES, SERUM, ELEVATED 09/01/2009  . Tubular adenoma of colon 10/2010   Past Surgical History:  Procedure Laterality Date  . COLONOSCOPY    . inguinal herniorrhapy  1976  . KNEE ARTHROSCOPY, MEDIAL PATELLO FEMORAL LIGAMENT RECONSTRUCTION W/ HAMSTRING GRAFT Left 2012   They cleaned cartilege out of my knee  . left foot surgery  08/2007  . POLYPECTOMY      reports that he has never smoked. He has never used smokeless tobacco. He reports that he does not drink alcohol or use drugs. family history includes Alcohol abuse in his mother; Colon polyps in his  maternal grandmother; Depression in his mother; Hypertension in his father; Prostate cancer in his father. No Known Allergies Current Outpatient Medications on File Prior to Visit  Medication Sig Dispense Refill  . amLODipine (NORVASC) 5 MG tablet Take 1 tablet (5 mg total) by mouth daily. 90 tablet 3  . aspirin EC 81 MG tablet Take 1 tablet (81 mg total) by mouth daily. 90 tablet 11  . atorvastatin (LIPITOR) 40 MG tablet TAKE 1 TABLET BY MOUTH EVERY DAY 90 tablet 0  . benazepril (LOTENSIN) 40 MG tablet Take 1 tablet (40 mg total) by mouth daily. 90 tablet 3  . buPROPion (WELLBUTRIN XL) 300 MG 24 hr tablet TAKE 1 TABLET BY MOUTH EVERY DAY 90 tablet 0  . dicyclomine (BENTYL) 10 MG capsule Take 1 capsule (10 mg total) by mouth 3 (three) times daily before meals. 90 capsule 11  . loratadine (CLARITIN) 10 MG tablet Take 10 mg by mouth daily.    . mirtazapine (REMERON) 45 MG tablet TAKE 1 TABLET BY MOUTH AT BEDTIME 90 tablet 1  . sildenafil (VIAGRA) 100 MG tablet Take 0.5-1 tablets (50-100 mg total) by mouth daily as needed for erectile dysfunction. 5 tablet 11   No current facility-administered medications on file prior to visit.    Review of Systems Constitutional: Negative for other unusual diaphoresis, sweats, appetite or weight changes HENT: Negative for other worsening hearing loss, ear pain, facial swelling, mouth sores or neck stiffness.   Eyes: Negative for other worsening pain, redness or other visual  disturbance.  Respiratory: Negative for other stridor or swelling Cardiovascular: Negative for other palpitations or other chest pain  Gastrointestinal: Negative for worsening diarrhea or loose stools, blood in stool, distention or other pain Genitourinary: Negative for hematuria, flank pain or other change in urine volume.  Musculoskeletal: Negative for myalgias or other joint swelling.  Skin: Negative for other color change, or other wound or worsening drainage.  Neurological: Negative  for other syncope or numbness. Hematological: Negative for other adenopathy or swelling Psychiatric/Behavioral: Negative for hallucinations, other worsening agitation, SI, self-injury, or new decreased concentration All other system neg per pt    Objective:   Physical Exam BP 126/84   Pulse 89   Temp 98.4 F (36.9 C) (Oral)   Ht 6\' 1"  (1.854 m)   Wt 195 lb (88.5 kg)   SpO2 98%   BMI 25.73 kg/m  VS noted,  Constitutional: Pt is oriented to person, place, and time. Appears well-developed and well-nourished, in no significant distress and comfortable Head: Normocephalic and atraumatic  Eyes: Conjunctivae and EOM are normal. Pupils are equal, round, and reactive to light Right Ear: External ear normal without discharge Left Ear: External ear normal without discharge Nose: Nose without discharge or deformity Mouth/Throat: Oropharynx is without other ulcerations and moist  Neck: Normal range of motion. Neck supple. No JVD present. No tracheal deviation present or significant neck LA or mass Cardiovascular: Normal rate, regular rhythm, normal heart sounds and intact distal pulses.   Pulmonary/Chest: WOB normal and breath sounds without rales or wheezing  Abdominal: Soft. Bowel sounds are normal. NT. No HSM  Musculoskeletal: Normal range of motion. Exhibits no edema Lymphadenopathy: Has no other cervical adenopathy.  Neurological: Pt is alert and oriented to person, place, and time. Pt has normal reflexes. No cranial nerve deficit. Motor grossly intact, Gait intact Skin: Skin is warm and dry. No rash noted or new ulcerations Psychiatric:  Has normal mood and affect. Behavior is normal without agitation No other exam findings Lab Results  Component Value Date   WBC 7.1 12/17/2018   HGB 14.6 12/17/2018   HCT 42.2 12/17/2018   PLT 284.0 12/17/2018   GLUCOSE 107 (H) 12/17/2018   CHOL 157 12/17/2018   TRIG 110.0 12/17/2018   HDL 44.90 12/17/2018   LDLDIRECT 76.0 12/19/2017   LDLCALC  90 12/17/2018   ALT 51 12/17/2018   AST 25 12/17/2018   NA 141 12/17/2018   K 4.3 12/17/2018   CL 106 12/17/2018   CREATININE 1.15 12/17/2018   BUN 12 12/17/2018   CO2 28 12/17/2018   TSH 3.37 12/17/2018   PSA 9.64 (H) 12/17/2018   INR 1.0 08/07/2017   HGBA1C 5.4 07/31/2018   POCT glycosylated hemoglobin (Hb A1C)  Order: [id number]  Status:  Final result Visible to patient:  No (Not Released) Dx:  Hyperglycemia   Ref Range & Units 08:41 19mo ago 19yr ago 89yr ago 69yr ago 41yr ago 63yr ago  Hemoglobin A1C 4.0 - 5.6 % 5.3  5.4 R, CM 5.4 R, CM 5.3 R, CM 5.6 R, CM 5.7 R, CM 4.7 R, CM            Assessment & Plan:

## 2018-12-25 NOTE — Assessment & Plan Note (Signed)
stable overall by history and exam, recent data reviewed with pt, and pt to continue medical treatment as before,  to f/u any worsening symptoms or concerns, for a1c with lab today

## 2018-12-25 NOTE — Assessment & Plan Note (Signed)
stable overall by history and exam, recent data reviewed with pt, and pt to continue medical treatment as before,  to f/u any worsening symptoms or concerns  

## 2018-12-25 NOTE — Assessment & Plan Note (Signed)

## 2019-06-17 ENCOUNTER — Other Ambulatory Visit: Payer: Self-pay | Admitting: Internal Medicine

## 2019-06-22 IMAGING — MR MR ABDOMEN WO/W CM
10 of 18 series · 22 of 48 positions shown · IV contrast (multihance)
Comparison: 08/14/2017 CT.

CLINICAL DATA: Elevated ferritin and iron saturation. Evaluate for
hemochromatosis. History of alcohol abuse.

EXAM:
MRI ABDOMEN WITHOUT AND WITH CONTRAST
TECHNIQUE: Multiplanar multisequence MR imaging of the abdomen was performed
both before and after the administration of intravenous contrast.
CONTRAST:  18mL MULTIHANCE GADOBENATE DIMEGLUMINE 529 MG/ML IV SOLN

[Series 3: T2 fat-sat · axial · 5.0mm · 0.78mm/px · 1 of 56 slices shown]
[im 1/56]
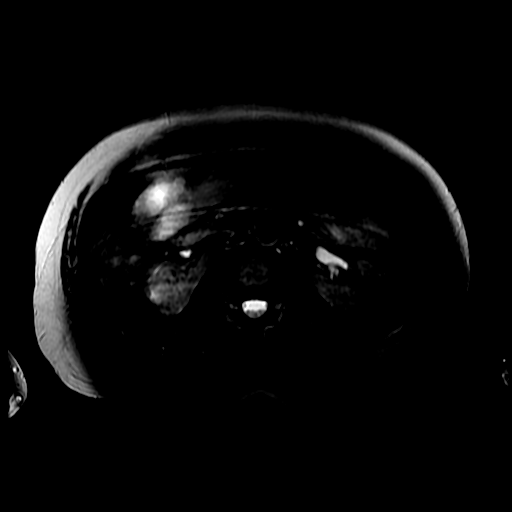

[Series 4: DWI b500 · axial · 6.0mm · 1.72mm/px · z∈[-147,+180]mm · 3 of 86 slices shown]
[im 1/86]
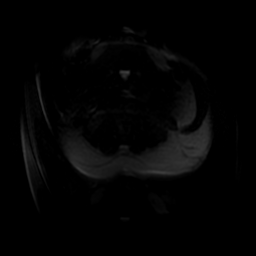
[im 43/86]
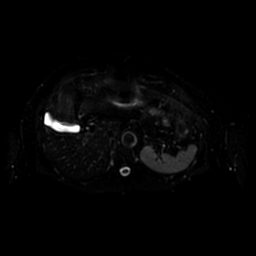
[im 86/86]
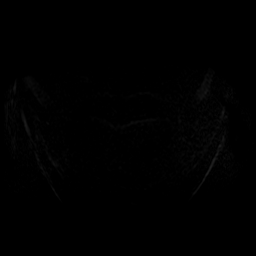

[Series 5: ax dualecho · axial · 5.0mm · 0.78mm/px · z∈[-98,+177]mm · 4 of 112 slices shown]
[im 1/112]
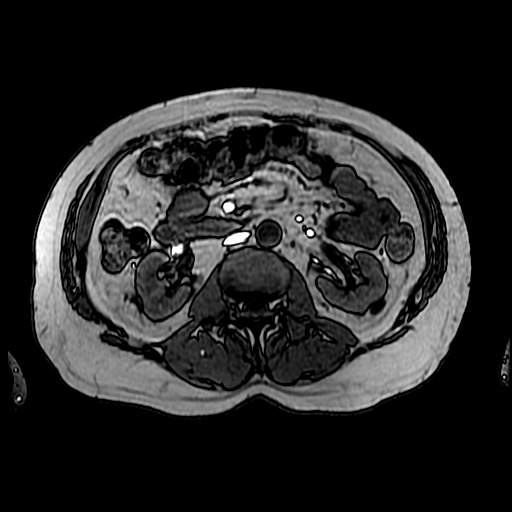
[im 38/112]
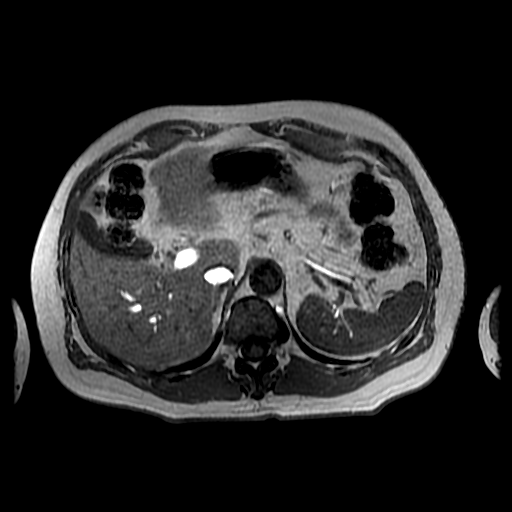
[im 75/112]
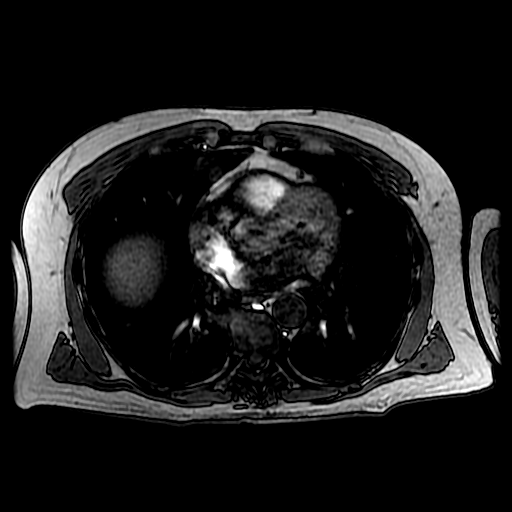
[im 112/112]
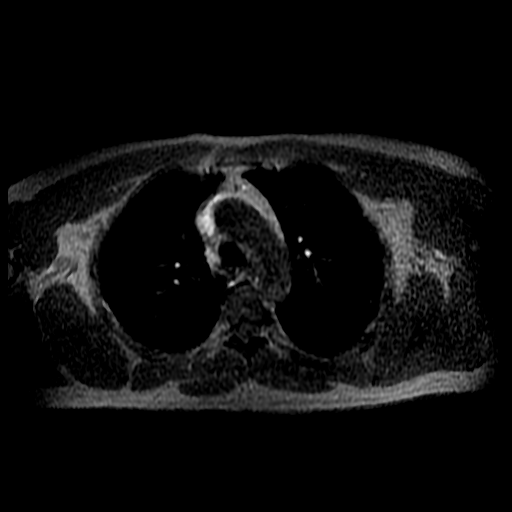

[Series 6: T2 · axial · 5.0mm · 0.78mm/px · z∈[-98,+177]mm · 2 of 56 slices shown (1 of 2)]
[im 1/56]
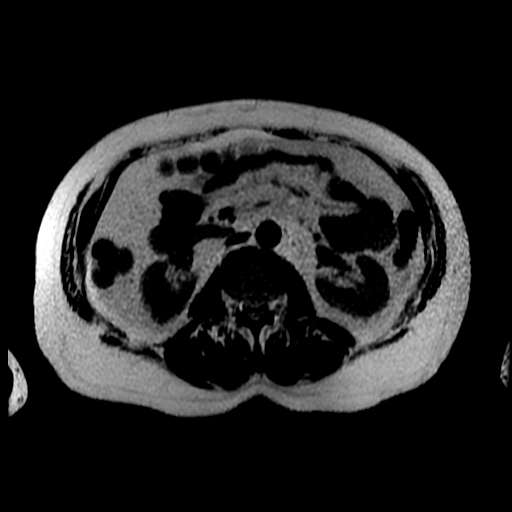
[im 56/56]
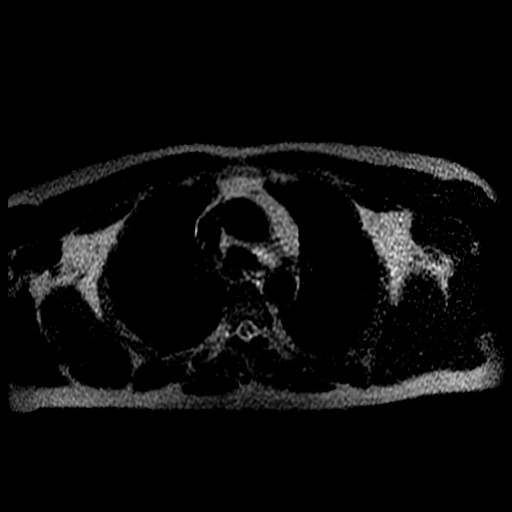

[Series 7: T2 · coronal · 5.0mm · 0.78mm/px · 2 of 48 slices shown (2 of 2)]
[im 1/48]
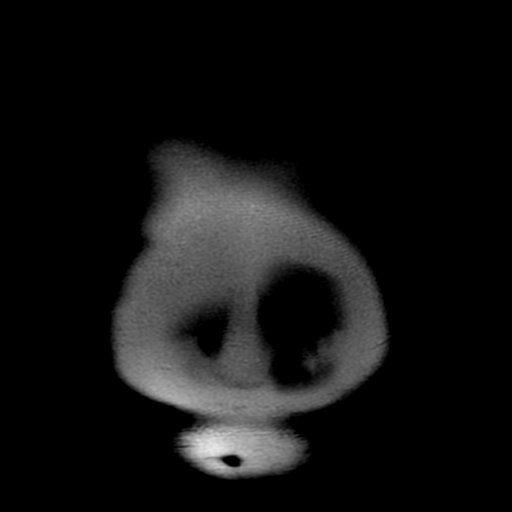
[im 48/48]
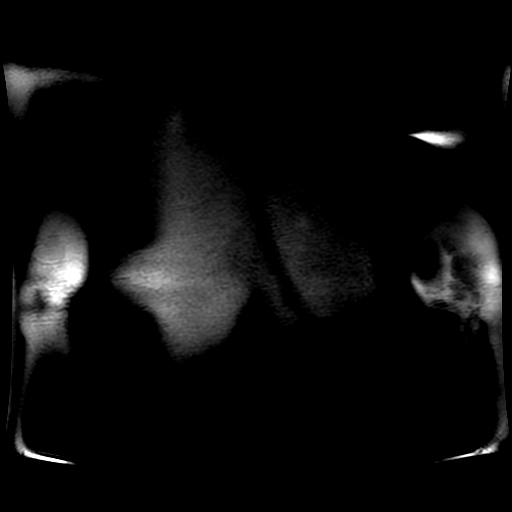

[Series 8: bSSFP · axial · 5.0mm · 0.78mm/px · z∈[-98,+177]mm · 2 of 56 slices shown]
[im 1/56]
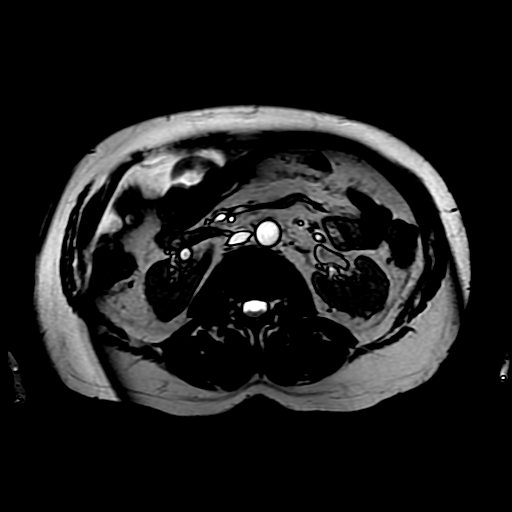
[im 56/56]
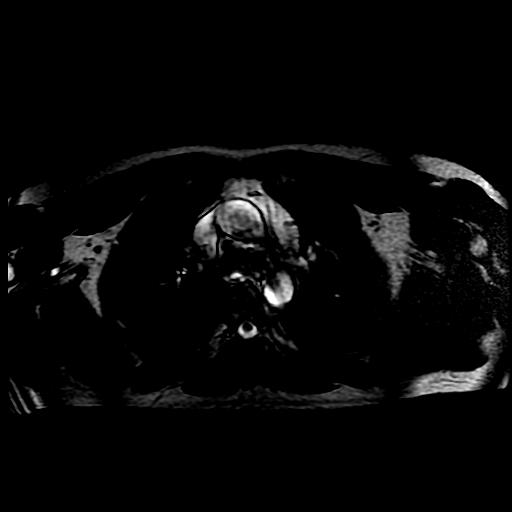

[Series 400: DWI · axial · 6.0mm · 1.72mm/px · 1 of 43 slices shown]
[im 1/43]
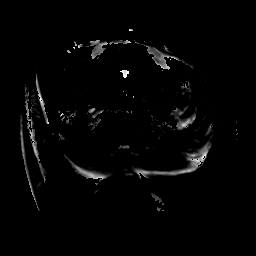

[Series 1100: T1 dynamic · axial · 5.8mm · 0.78mm/px · z∈[-105,+147]mm · 3 of 88 slices shown (1 of 3)]
[im 1/88]
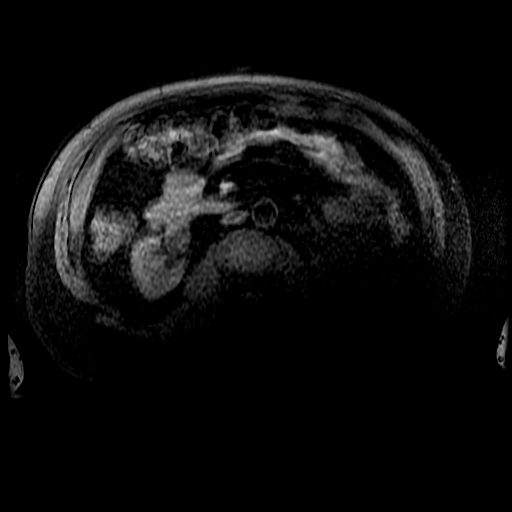
[im 44/88]
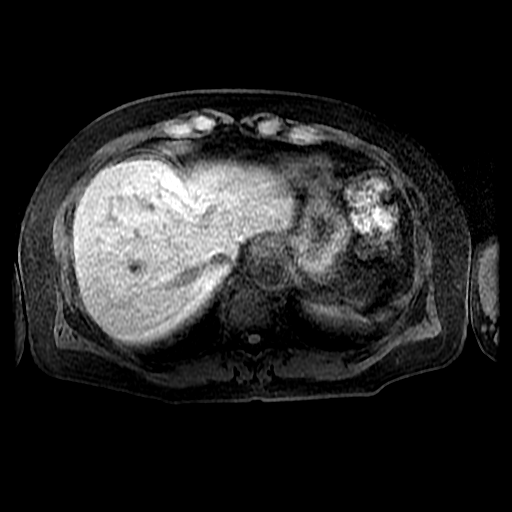
[im 88/88]
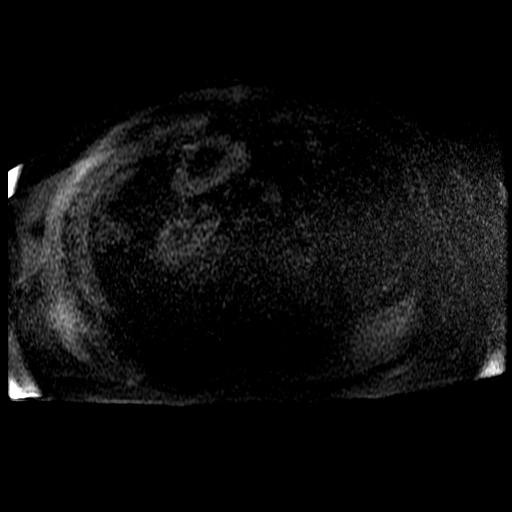

[Series 1101: T1 dynamic · axial · 5.8mm · 0.78mm/px · z∈[-105,+147]mm · 3 of 88 slices shown (2 of 3)]
[im 1/88]
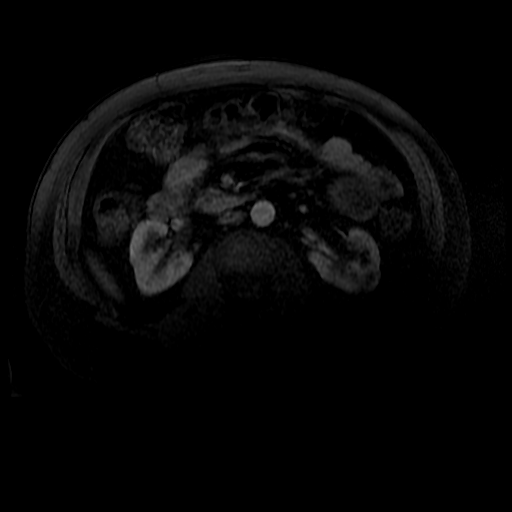
[im 44/88]
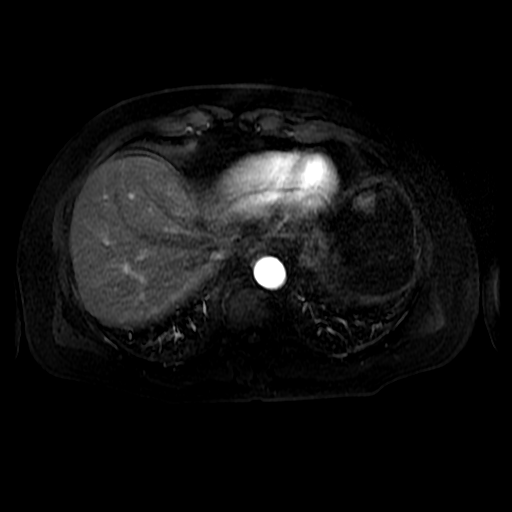
[im 88/88]
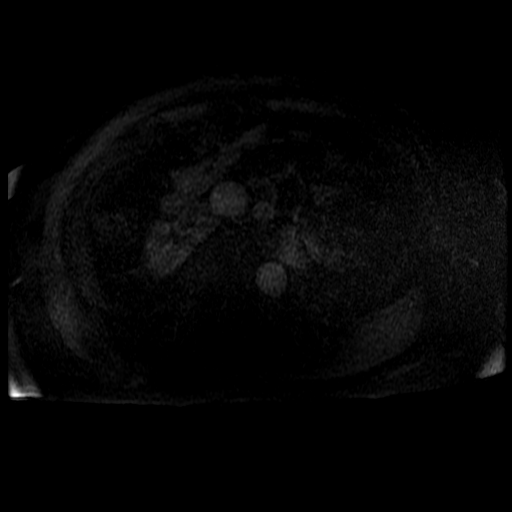

[Series 1102: T1 dynamic · axial · 5.8mm · 0.78mm/px · 1 of 88 slices shown (3 of 3)]
[im 1/88]
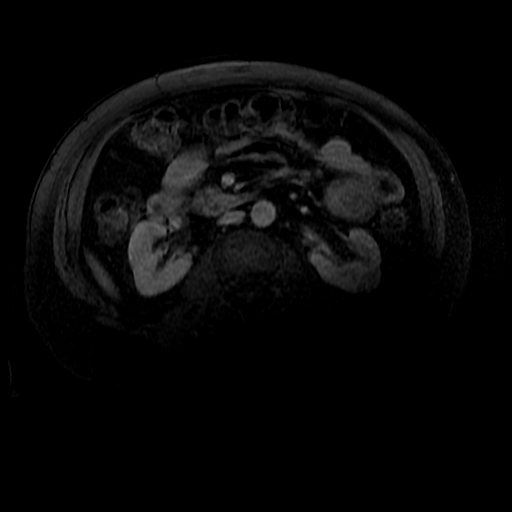

[22 of 48 positions shown; findings below may reference images not displayed]

FINDINGS: Portions of exam are minimally motion degraded.

Lower chest: Normal heart size without pericardial or pleural
effusion.

Hepatobiliary: No evidence of iron deposition in the liver. No
cirrhosis. No focal liver lesion. Normal gallbladder, without
biliary ductal dilatation.

Pancreas:  Normal, without mass or ductal dilatation.

Spleen:  Normal spleen.  No iron deposition identified within.

Adrenals/Urinary Tract: Normal adrenal glands. Normal kidneys,
without hydronephrosis.

Stomach/Bowel: Normal stomach and abdominal bowel loops.

Vascular/Lymphatic: Normal caliber of the aorta and branch vessels.
No retroperitoneal or retrocrural adenopathy.

Other:  No ascites.

Musculoskeletal: No acute osseous abnormality.
IMPRESSION: No evidence of hemochromatosis. No cirrhosis or other acute process.

## 2019-09-22 DIAGNOSIS — L573 Poikiloderma of Civatte: Secondary | ICD-10-CM | POA: Diagnosis not present

## 2019-09-22 DIAGNOSIS — L570 Actinic keratosis: Secondary | ICD-10-CM | POA: Diagnosis not present

## 2019-09-22 DIAGNOSIS — L720 Epidermal cyst: Secondary | ICD-10-CM | POA: Diagnosis not present

## 2019-09-22 DIAGNOSIS — D225 Melanocytic nevi of trunk: Secondary | ICD-10-CM | POA: Diagnosis not present

## 2019-09-22 DIAGNOSIS — Z85828 Personal history of other malignant neoplasm of skin: Secondary | ICD-10-CM | POA: Diagnosis not present

## 2019-09-22 DIAGNOSIS — L738 Other specified follicular disorders: Secondary | ICD-10-CM | POA: Diagnosis not present

## 2019-09-22 DIAGNOSIS — D485 Neoplasm of uncertain behavior of skin: Secondary | ICD-10-CM | POA: Diagnosis not present

## 2019-09-22 DIAGNOSIS — L820 Inflamed seborrheic keratosis: Secondary | ICD-10-CM | POA: Diagnosis not present

## 2019-12-13 ENCOUNTER — Other Ambulatory Visit: Payer: Self-pay | Admitting: Internal Medicine

## 2019-12-23 ENCOUNTER — Other Ambulatory Visit (INDEPENDENT_AMBULATORY_CARE_PROVIDER_SITE_OTHER): Payer: BC Managed Care – PPO

## 2019-12-23 DIAGNOSIS — Z0000 Encounter for general adult medical examination without abnormal findings: Secondary | ICD-10-CM | POA: Diagnosis not present

## 2019-12-23 DIAGNOSIS — Z125 Encounter for screening for malignant neoplasm of prostate: Secondary | ICD-10-CM | POA: Diagnosis not present

## 2019-12-23 DIAGNOSIS — R739 Hyperglycemia, unspecified: Secondary | ICD-10-CM | POA: Diagnosis not present

## 2019-12-23 LAB — CBC WITH DIFFERENTIAL/PLATELET
Basophils Absolute: 0 10*3/uL (ref 0.0–0.1)
Basophils Relative: 0.5 % (ref 0.0–3.0)
Eosinophils Absolute: 0.1 10*3/uL (ref 0.0–0.7)
Eosinophils Relative: 1.8 % (ref 0.0–5.0)
HCT: 42.4 % (ref 39.0–52.0)
Hemoglobin: 14.4 g/dL (ref 13.0–17.0)
Lymphocytes Relative: 25.1 % (ref 12.0–46.0)
Lymphs Abs: 2 10*3/uL (ref 0.7–4.0)
MCHC: 34 g/dL (ref 30.0–36.0)
MCV: 94.6 fl (ref 78.0–100.0)
Monocytes Absolute: 0.6 10*3/uL (ref 0.1–1.0)
Monocytes Relative: 7.5 % (ref 3.0–12.0)
Neutro Abs: 5.1 10*3/uL (ref 1.4–7.7)
Neutrophils Relative %: 65.1 % (ref 43.0–77.0)
Platelets: 314 10*3/uL (ref 150.0–400.0)
RBC: 4.49 Mil/uL (ref 4.22–5.81)
RDW: 14.2 % (ref 11.5–15.5)
WBC: 7.8 10*3/uL (ref 4.0–10.5)

## 2019-12-23 LAB — URINALYSIS, ROUTINE W REFLEX MICROSCOPIC
Bilirubin Urine: NEGATIVE
Hgb urine dipstick: NEGATIVE
Ketones, ur: NEGATIVE
Leukocytes,Ua: NEGATIVE
Nitrite: NEGATIVE
RBC / HPF: NONE SEEN (ref 0–?)
Specific Gravity, Urine: 1.01 (ref 1.000–1.030)
Total Protein, Urine: NEGATIVE
Urine Glucose: NEGATIVE
Urobilinogen, UA: 0.2 (ref 0.0–1.0)
pH: 5.5 (ref 5.0–8.0)

## 2019-12-23 LAB — BASIC METABOLIC PANEL
BUN: 17 mg/dL (ref 6–23)
CO2: 28 mEq/L (ref 19–32)
Calcium: 9.4 mg/dL (ref 8.4–10.5)
Chloride: 105 mEq/L (ref 96–112)
Creatinine, Ser: 1.07 mg/dL (ref 0.40–1.50)
GFR: 70.66 mL/min (ref >60.00)
Glucose, Bld: 65 mg/dL — ABNORMAL LOW (ref 70–99)
Potassium: 4.2 mEq/L (ref 3.5–5.1)
Sodium: 141 mEq/L (ref 135–145)

## 2019-12-23 LAB — LIPID PANEL
Cholesterol: 152 mg/dL (ref 0–200)
HDL: 41.7 mg/dL (ref >39.00)
LDL Cholesterol: 87 mg/dL (ref 0–99)
NonHDL: 109.92
Total CHOL/HDL Ratio: 4
Triglycerides: 116 mg/dL (ref 0.0–149.0)
VLDL: 23.2 mg/dL (ref 0.0–40.0)

## 2019-12-23 LAB — HEMOGLOBIN A1C: Hgb A1c MFr Bld: 5.5 % (ref 4.6–6.5)

## 2019-12-23 LAB — HEPATIC FUNCTION PANEL
ALT: 57 U/L — ABNORMAL HIGH (ref 0–53)
AST: 23 U/L (ref 0–37)
Albumin: 4.1 g/dL (ref 3.5–5.2)
Alkaline Phosphatase: 82 U/L (ref 39–117)
Bilirubin, Direct: 0.1 mg/dL (ref 0.0–0.3)
Total Bilirubin: 0.4 mg/dL (ref 0.2–1.2)
Total Protein: 6.3 g/dL (ref 6.0–8.3)

## 2019-12-23 LAB — PSA: PSA: 9.83 ng/mL — ABNORMAL HIGH (ref 0.10–4.00)

## 2019-12-23 LAB — TSH: TSH: 3.09 u[IU]/mL (ref 0.35–4.50)

## 2019-12-30 ENCOUNTER — Encounter: Payer: Self-pay | Admitting: Internal Medicine

## 2019-12-30 ENCOUNTER — Ambulatory Visit (INDEPENDENT_AMBULATORY_CARE_PROVIDER_SITE_OTHER): Payer: BC Managed Care – PPO | Admitting: Internal Medicine

## 2019-12-30 ENCOUNTER — Other Ambulatory Visit: Payer: Self-pay

## 2019-12-30 VITALS — BP 146/100 | HR 70 | Temp 98.2°F | Ht 73.0 in | Wt 209.0 lb

## 2019-12-30 DIAGNOSIS — R351 Nocturia: Secondary | ICD-10-CM | POA: Diagnosis not present

## 2019-12-30 DIAGNOSIS — I10 Essential (primary) hypertension: Secondary | ICD-10-CM

## 2019-12-30 DIAGNOSIS — Z0000 Encounter for general adult medical examination without abnormal findings: Secondary | ICD-10-CM | POA: Diagnosis not present

## 2019-12-30 DIAGNOSIS — R739 Hyperglycemia, unspecified: Secondary | ICD-10-CM

## 2019-12-30 DIAGNOSIS — R972 Elevated prostate specific antigen [PSA]: Secondary | ICD-10-CM | POA: Diagnosis not present

## 2019-12-30 DIAGNOSIS — Z23 Encounter for immunization: Secondary | ICD-10-CM

## 2019-12-30 DIAGNOSIS — N401 Enlarged prostate with lower urinary tract symptoms: Secondary | ICD-10-CM | POA: Diagnosis not present

## 2019-12-30 DIAGNOSIS — M545 Low back pain: Secondary | ICD-10-CM | POA: Diagnosis not present

## 2019-12-30 MED ORDER — BUPROPION HCL ER (XL) 300 MG PO TB24: 300.0000 mg | ORAL_TABLET | Freq: Every day | ORAL | 3 refills | Status: DC

## 2019-12-30 MED ORDER — MIRTAZAPINE 45 MG PO TABS: 45.0000 mg | ORAL_TABLET | Freq: Every day | ORAL | 3 refills | Status: DC

## 2019-12-30 MED ORDER — BENAZEPRIL HCL 40 MG PO TABS: 40.0000 mg | ORAL_TABLET | Freq: Every day | ORAL | 3 refills | Status: DC

## 2019-12-30 MED ORDER — SILDENAFIL CITRATE 100 MG PO TABS: 50.0000 mg | ORAL_TABLET | Freq: Every day | ORAL | 11 refills | Status: DC | PRN

## 2019-12-30 MED ORDER — ATORVASTATIN CALCIUM 40 MG PO TABS: 40.0000 mg | ORAL_TABLET | Freq: Every day | ORAL | 3 refills | Status: DC

## 2019-12-30 MED ORDER — AMLODIPINE BESYLATE 10 MG PO TABS: 10.0000 mg | ORAL_TABLET | Freq: Every day | ORAL | 3 refills | Status: DC

## 2019-12-30 NOTE — Assessment & Plan Note (Signed)
stable overall by history and exam, recent data reviewed with pt, and pt to continue medical treatment as before,  to f/u any worsening symptoms or concerns  

## 2019-12-30 NOTE — Patient Instructions (Addendum)
You had the Tdap tetanus shot today  You are on the [facility] Vaccine Wait list  Ok to increase the amlodipine to 10 mg per day  Please continue all other medications as before, and refills have been done if requested.  Please have the pharmacy call with any other refills you may need.  Please continue your efforts at being more active, low cholesterol diet, and weight control.  You are otherwise up to date with prevention measures today.  Please keep your appointments with your specialists as you may have planned  Please make an Appointment to return for your 1 year visit, or sooner if needed, with Lab testing by Appointment as well, to be done about 3-5 days before at the [facility] (so this is for TWO appointments - please see the scheduling desk as you leave)

## 2019-12-30 NOTE — Progress Notes (Signed)
Subjective:    Patient ID: Fred [name], male    DOB: 1961-02-09, 59 y.o.   MRN: [id number]  HPI  Here for wellness and f/u;  Overall doing ok;  Pt denies Chest pain, worsening SOB, DOE, wheezing, orthopnea, PND, worsening LE edema, palpitations, dizziness or syncope.  Pt denies neurological change such as new headache, facial or extremity weakness.  Pt denies polydipsia, polyuria, or low sugar symptoms. Pt states overall good compliance with treatment and medications, good tolerability, and has been trying to follow appropriate diet.  Pt denies worsening depressive symptoms, suicidal ideation or panic. No fever, night sweats, wt loss, loss of appetite, or other constitutional symptoms.  Pt states good ability with ADL's, has low fall risk, home safety reviewed and adequate, no other significant changes in hearing or vision, and only occasionally active with exercise  Due for Tdap.  BP has been 140/s/90s.   BP Readings from Last 3 Encounters:  12/30/19 (!) 146/100  12/25/18 126/84  04/16/18 (!) 170/116   Wt Readings from Last 3 Encounters:  12/30/19 209 lb (94.8 kg)  12/25/18 195 lb (88.5 kg)  04/16/18 198 lb (89.8 kg)   Past Medical History:  Diagnosis Date  . ALCOHOL ABUSE, EPISODIC, HX OF 02/15/2008  . ALLERGIC RHINITIS 02/15/2008  . DEPRESSION 07/28/2007  . HEARING LOSS 08/05/2008  . HYPERLIPIDEMIA 02/11/2008  . HYPERTENSION 07/28/2007  . PSA, INCREASED 10/01/2010  . TRANSAMINASES, SERUM, ELEVATED 09/01/2009  . Tubular adenoma of colon 10/2010   Past Surgical History:  Procedure Laterality Date  . COLONOSCOPY    . inguinal herniorrhapy  1976  . KNEE ARTHROSCOPY, MEDIAL PATELLO FEMORAL LIGAMENT RECONSTRUCTION W/ HAMSTRING GRAFT Left 2012   They cleaned cartilege out of my knee  . left foot surgery  08/2007  . POLYPECTOMY      reports that he has never smoked. He has never used smokeless tobacco. He reports that he does not drink alcohol or use drugs. family history includes Alcohol  abuse in his mother; Colon polyps in his maternal grandmother; Depression in his mother; Hypertension in his father; Prostate cancer in his father. No Known Allergies Current Outpatient Medications on File Prior to Visit  Medication Sig Dispense Refill  . aspirin EC 81 MG tablet Take 1 tablet (81 mg total) by mouth daily. 90 tablet 11  . loratadine (CLARITIN) 10 MG tablet Take 10 mg by mouth daily.    Marland Kitchen dicyclomine (BENTYL) 10 MG capsule Take 1 capsule (10 mg total) by mouth 3 (three) times daily before meals. (Patient not taking: Reported on 12/30/2019) 90 capsule 11   No current facility-administered medications on file prior to visit.   Review of Systems All otherwise neg per pt     Objective:   Physical Exam BP (!) 146/100   Pulse 70   Temp 98.2 F (36.8 C)   Ht 6\' 1"  (1.854 m)   Wt 209 lb (94.8 kg)   SpO2 100%   BMI 27.57 kg/m  VS noted,  Constitutional: Pt appears in NAD HENT: Head: NCAT.  Right Ear: External ear normal.  Left Ear: External ear normal.  Eyes: . Pupils are equal, round, and reactive to light. Conjunctivae and EOM are normal Nose: without d/c or deformity Neck: Neck supple. Gross normal ROM Cardiovascular: Normal rate and regular rhythm.   Pulmonary/Chest: Effort normal and breath sounds without rales or wheezing.  Abd:  Soft, NT, ND, + BS, no organomegaly Neurological: Pt is alert. At baseline orientation, motor grossly  intact Skin: Skin is warm. No rashes, other new lesions, no LE edema Psychiatric: Pt behavior is normal without agitation  Lab Results  Component Value Date   WBC 7.8 12/23/2019   HGB 14.4 12/23/2019   HCT 42.4 12/23/2019   PLT 314.0 12/23/2019   GLUCOSE 65 (L) 12/23/2019   CHOL 152 12/23/2019   TRIG 116.0 12/23/2019   HDL 41.70 12/23/2019   LDLDIRECT 76.0 12/19/2017   LDLCALC 87 12/23/2019   ALT 57 (H) 12/23/2019   AST 23 12/23/2019   NA 141 12/23/2019   K 4.2 12/23/2019   CL 105 12/23/2019   CREATININE 1.07 12/23/2019    BUN 17 12/23/2019   CO2 28 12/23/2019   TSH 3.09 12/23/2019   PSA 9.83 (H) 12/23/2019   INR 1.0 08/07/2017   HGBA1C 5.5 12/23/2019      Assessment & Plan:

## 2019-12-30 NOTE — Assessment & Plan Note (Signed)

## 2020-01-11 DIAGNOSIS — M546 Pain in thoracic spine: Secondary | ICD-10-CM | POA: Diagnosis not present

## 2020-01-18 DIAGNOSIS — M546 Pain in thoracic spine: Secondary | ICD-10-CM | POA: Diagnosis not present

## 2020-01-25 DIAGNOSIS — M546 Pain in thoracic spine: Secondary | ICD-10-CM | POA: Diagnosis not present

## 2020-02-17 ENCOUNTER — Ambulatory Visit: Payer: BC Managed Care – PPO | Attending: Internal Medicine

## 2020-02-17 DIAGNOSIS — Z23 Encounter for immunization: Secondary | ICD-10-CM

## 2020-02-17 NOTE — Progress Notes (Signed)
   Covid-19 Vaccination Clinic  Name:  DEQUANTA KUEHLER    MRN: [id number] DOB: [birth year]  02/17/2020  Mr. Lupi was observed post Covid-19 immunization for 15 minutes without incident. He was provided with Vaccine Information Sheet and instruction to access the V-Safe system.   Mr. Labruzzo was instructed to call 911 with any severe reactions post vaccine: Marland Kitchen Difficulty breathing  . Swelling of face and throat  . A fast heartbeat  . A bad rash all over body  . Dizziness and weakness   Immunizations Administered    Name Date Dose VIS Date Route   Pfizer COVID-19 Vaccine 02/17/2020 10:28 AM 0.3 mL 11/05/2019 Intramuscular   Manufacturer: [place]   Lot: [id number]   [place]: [id number]

## 2020-03-13 ENCOUNTER — Ambulatory Visit: Payer: BC Managed Care – PPO | Attending: Internal Medicine

## 2020-03-13 DIAGNOSIS — Z23 Encounter for immunization: Secondary | ICD-10-CM

## 2020-03-13 NOTE — Progress Notes (Signed)
   Covid-19 Vaccination Clinic  Name:  Fred [name]    MRN: [id number] DOB: [birth year]  03/13/2020  [name] was observed post Covid-19 immunization for 15 minutes without incident. He was provided with Vaccine Information Sheet and instruction to access the V-Safe system.   [name] was instructed to call 911 with any severe reactions post vaccine: Marland Kitchen Difficulty breathing  . Swelling of face and throat  . A fast heartbeat  . A bad rash all over body  . Dizziness and weakness   Immunizations Administered    Name Date Dose VIS Date Route   Pfizer COVID-19 Vaccine 03/13/2020  9:43 AM 0.3 mL 01/19/2019 Intramuscular   Manufacturer: [place]   Lot: [id number]   [place]: [id number]

## 2020-04-12 ENCOUNTER — Ambulatory Visit (INDEPENDENT_AMBULATORY_CARE_PROVIDER_SITE_OTHER): Payer: BC Managed Care – PPO | Admitting: Internal Medicine

## 2020-04-12 ENCOUNTER — Other Ambulatory Visit: Payer: Self-pay

## 2020-04-12 ENCOUNTER — Encounter: Payer: Self-pay | Admitting: Internal Medicine

## 2020-04-12 VITALS — BP 160/118 | HR 85 | Temp 98.3°F | Ht 73.0 in | Wt 204.0 lb

## 2020-04-12 DIAGNOSIS — R739 Hyperglycemia, unspecified: Secondary | ICD-10-CM | POA: Diagnosis not present

## 2020-04-12 DIAGNOSIS — R972 Elevated prostate specific antigen [PSA]: Secondary | ICD-10-CM

## 2020-04-12 DIAGNOSIS — R609 Edema, unspecified: Secondary | ICD-10-CM

## 2020-04-12 DIAGNOSIS — H9191 Unspecified hearing loss, right ear: Secondary | ICD-10-CM

## 2020-04-12 DIAGNOSIS — I10 Essential (primary) hypertension: Secondary | ICD-10-CM | POA: Diagnosis not present

## 2020-04-12 MED ORDER — AMLODIPINE BESYLATE 5 MG PO TABS: 5.0000 mg | ORAL_TABLET | Freq: Every day | ORAL | 3 refills | Status: DC

## 2020-04-12 MED ORDER — HYDROCHLOROTHIAZIDE 12.5 MG PO CAPS: 12.5000 mg | ORAL_CAPSULE | Freq: Every day | ORAL | 3 refills | Status: DC

## 2020-04-12 NOTE — Progress Notes (Signed)
Subjective:    Patient ID: Fred [name], male    DOB: Feb 09, 1961, 59 y.o.   MRN: [id number]  HPI  Here to f/u; overall doing ok,  Pt denies chest pain, increasing sob or doe, wheezing, orthopnea, PND, increased LE swelling, palpitations, dizziness or syncope.  Pt denies new neurological symptoms such as new headache, or facial or extremity weakness or numbness.  Pt denies polydipsia, polyuria, or low sugar episode.  Pt states overall good compliance with meds, but Forced to stop the amlodipine 10 mg due to left > right distal leg swelling, worst after the actual 10 mg pill start 2 wks ago; no recent ETOH use at all.  BP at home 150/90 after stopped the 10 mg amlod about 5 days ago, o/w has good compliance with this. Denies urinary symptoms such as dysuria, frequency, urgency, flank pain, hematuria or n/v, fever, chills.  Also has 1 wk onset reduced hearing with popping at times, but no ha, d/c, swelling or drainage Past Medical History:  Diagnosis Date  . ALCOHOL ABUSE, EPISODIC, HX OF 02/15/2008  . ALLERGIC RHINITIS 02/15/2008  . DEPRESSION 07/28/2007  . HEARING LOSS 08/05/2008  . HYPERLIPIDEMIA 02/11/2008  . HYPERTENSION 07/28/2007  . PSA, INCREASED 10/01/2010  . TRANSAMINASES, SERUM, ELEVATED 09/01/2009  . Tubular adenoma of colon 10/2010   Past Surgical History:  Procedure Laterality Date  . COLONOSCOPY    . inguinal herniorrhapy  1976  . KNEE ARTHROSCOPY, MEDIAL PATELLO FEMORAL LIGAMENT RECONSTRUCTION W/ HAMSTRING GRAFT Left 2012   They cleaned cartilege out of my knee  . left foot surgery  08/2007  . POLYPECTOMY      reports that he has never smoked. He has never used smokeless tobacco. He reports that he does not drink alcohol or use drugs. family history includes Alcohol abuse in his mother; Colon polyps in his maternal grandmother; Depression in his mother; Hypertension in his father; Prostate cancer in his father. No Known Allergies Current Outpatient Medications on File Prior to  Visit  Medication Sig Dispense Refill  . aspirin EC 81 MG tablet Take 1 tablet (81 mg total) by mouth daily. 90 tablet 11  . atorvastatin (LIPITOR) 40 MG tablet Take 1 tablet (40 mg total) by mouth daily. 90 tablet 3  . benazepril (LOTENSIN) 40 MG tablet Take 1 tablet (40 mg total) by mouth daily. 90 tablet 3  . buPROPion (WELLBUTRIN XL) 300 MG 24 hr tablet Take 1 tablet (300 mg total) by mouth daily. 90 tablet 3  . dicyclomine (BENTYL) 10 MG capsule Take 1 capsule (10 mg total) by mouth 3 (three) times daily before meals. 90 capsule 11  . loratadine (CLARITIN) 10 MG tablet Take 10 mg by mouth daily.    . mirtazapine (REMERON) 45 MG tablet Take 1 tablet (45 mg total) by mouth at bedtime. 90 tablet 3  . sildenafil (VIAGRA) 100 MG tablet Take 0.5-1 tablets (50-100 mg total) by mouth daily as needed for erectile dysfunction. 10 tablet 11   No current facility-administered medications on file prior to visit.   Review of Systems All otherwise neg per pt    Objective:   Physical Exam BP (!) 160/118 (BP Location: Left Arm, Patient Position: Sitting, Cuff Size: Large)   Pulse 85   Temp 98.3 F (36.8 C) (Oral)   Ht 6\' 1"  (1.854 m)   Wt 204 lb (92.5 kg)   SpO2 99%   BMI 26.91 kg/m  VS noted,  Constitutional: Pt appears in NAD HENT:  Head: NCAT.  Right Ear: External ear normal.  Left Ear: External ear normal.  Eyes: . Pupils are equal, round, and reactive to light. Conjunctivae and EOM are normal Right canal clear after irrigation wax impaction and hearing improved Nose: without d/c or deformity Neck: Neck supple. Gross normal ROM Cardiovascular: Normal rate and regular rhythm.   Pulmonary/Chest: Effort normal and breath sounds without rales or wheezing.  Abd:  Soft, NT, ND, + BS, no organomegaly Neurological: Pt is alert. At baseline orientation, motor grossly intact Skin: Skin is warm. No rashes, other new lesions, trace to 1+ bilat LE edema Psychiatric: Pt behavior is normal without  agitation  All otherwise neg per pt Lab Results  Component Value Date   WBC 7.8 12/23/2019   HGB 14.4 12/23/2019   HCT 42.4 12/23/2019   PLT 314.0 12/23/2019   GLUCOSE 65 (L) 12/23/2019   CHOL 152 12/23/2019   TRIG 116.0 12/23/2019   HDL 41.70 12/23/2019   LDLDIRECT 76.0 12/19/2017   LDLCALC 87 12/23/2019   ALT 57 (H) 12/23/2019   AST 23 12/23/2019   NA 141 12/23/2019   K 4.2 12/23/2019   CL 105 12/23/2019   CREATININE 1.07 12/23/2019   BUN 17 12/23/2019   CO2 28 12/23/2019   TSH 3.09 12/23/2019   PSA 9.83 (H) 12/23/2019   INR 1.0 08/07/2017   HGBA1C 5.5 12/23/2019      Assessment & Plan:

## 2020-04-12 NOTE — Patient Instructions (Signed)
Your right ear was irrigated of wax today  Ok to stop the amlodipine 10 mg as you have  Please take all new medication as prescribed - the amlodipine 5 mg, as well as the HCT (hydrochlorothiazide) fluid pill at 12.5 mg per day  Please continue to monitor your BP starting in 1 week, with the goal being to be less than 140/90  Please continue all other medications as before, including the benazepril  Please have the pharmacy call with any other refills you may need.  Please continue your efforts at being more active, low cholesterol diet, and weight control.  Please keep your appointments with your specialists as you may have planned

## 2020-04-15 ENCOUNTER — Encounter: Payer: Self-pay | Admitting: Internal Medicine

## 2020-04-15 DIAGNOSIS — R609 Edema, unspecified: Secondary | ICD-10-CM | POA: Insufficient documentation

## 2020-04-15 DIAGNOSIS — H9191 Unspecified hearing loss, right ear: Secondary | ICD-10-CM | POA: Insufficient documentation

## 2020-04-15 NOTE — Assessment & Plan Note (Addendum)
Mild, should improved with reduced amlodipine, and add hct 12.5 for edema and bp control

## 2020-04-15 NOTE — Assessment & Plan Note (Addendum)
stable overall by history and exam, recent data reviewed with pt, and pt to continue medical treatment as before,  to f/u any worsening symptoms or concerns  I spent 31 minutes in preparing to see the patient by review of recent labs, imaging and procedures, obtaining and reviewing separately obtained history, communicating with the patient and family or caregiver, ordering medications, tests or procedures, and documenting clinical information in the EHR including the differential Dx, treatment, and any further evaluation and other management of htn, elevated psa, edema, hyperglycemia, acute right hearing loss

## 2020-04-15 NOTE — Assessment & Plan Note (Signed)
Improved s/p irrigaiton,  to f/u any worsening symptoms or concerns

## 2020-04-15 NOTE — Assessment & Plan Note (Signed)
stable overall by history and exam, recent data reviewed with pt, and pt to continue medical treatment as before,  to f/u any worsening symptoms or concerns  

## 2020-04-15 NOTE — Assessment & Plan Note (Signed)
Lab Results  Component Value Date   PSA 9.83 (H) 12/23/2019   PSA 9.64 (H) 12/17/2018   PSA 14.62 (H) 07/31/2018   For urology f/u, asympt

## 2020-06-06 ENCOUNTER — Other Ambulatory Visit: Payer: Self-pay | Admitting: Internal Medicine

## 2020-06-19 DIAGNOSIS — L570 Actinic keratosis: Secondary | ICD-10-CM | POA: Diagnosis not present

## 2020-06-19 DIAGNOSIS — L820 Inflamed seborrheic keratosis: Secondary | ICD-10-CM | POA: Diagnosis not present

## 2020-10-03 DIAGNOSIS — D225 Melanocytic nevi of trunk: Secondary | ICD-10-CM | POA: Diagnosis not present

## 2020-10-03 DIAGNOSIS — L570 Actinic keratosis: Secondary | ICD-10-CM | POA: Diagnosis not present

## 2020-10-03 DIAGNOSIS — Z85828 Personal history of other malignant neoplasm of skin: Secondary | ICD-10-CM | POA: Diagnosis not present

## 2020-10-03 DIAGNOSIS — L820 Inflamed seborrheic keratosis: Secondary | ICD-10-CM | POA: Diagnosis not present

## 2020-10-03 DIAGNOSIS — L738 Other specified follicular disorders: Secondary | ICD-10-CM | POA: Diagnosis not present

## 2020-10-03 DIAGNOSIS — D224 Melanocytic nevi of scalp and neck: Secondary | ICD-10-CM | POA: Diagnosis not present

## 2020-12-18 ENCOUNTER — Other Ambulatory Visit: Payer: Self-pay | Admitting: Internal Medicine

## 2020-12-18 NOTE — Telephone Encounter (Signed)
Please refill as per office routine med refill policy (all routine meds refilled for 3 mo or monthly per pt preference up to one year from last visit, then month to month grace period for 3 mo, then further med refills will have to be denied)  

## 2020-12-19 ENCOUNTER — Other Ambulatory Visit: Payer: Self-pay | Admitting: Internal Medicine

## 2020-12-19 NOTE — Telephone Encounter (Signed)
Please refill as per office routine med refill policy (all routine meds refilled for 3 mo or monthly per pt preference up to one year from last visit, then month to month grace period for 3 mo, then further med refills will have to be denied)  

## 2020-12-21 ENCOUNTER — Other Ambulatory Visit: Payer: Self-pay

## 2021-01-01 ENCOUNTER — Other Ambulatory Visit: Payer: Self-pay

## 2021-01-01 ENCOUNTER — Other Ambulatory Visit (INDEPENDENT_AMBULATORY_CARE_PROVIDER_SITE_OTHER): Payer: BC Managed Care – PPO

## 2021-01-01 DIAGNOSIS — Z125 Encounter for screening for malignant neoplasm of prostate: Secondary | ICD-10-CM

## 2021-01-01 DIAGNOSIS — Z0000 Encounter for general adult medical examination without abnormal findings: Secondary | ICD-10-CM

## 2021-01-01 DIAGNOSIS — R739 Hyperglycemia, unspecified: Secondary | ICD-10-CM | POA: Diagnosis not present

## 2021-01-01 LAB — LIPID PANEL
Cholesterol: 140 mg/dL (ref 0–200)
HDL: 37.5 mg/dL — ABNORMAL LOW (ref >39.00)
LDL Cholesterol: 73 mg/dL (ref 0–99)
NonHDL: 102.78
Total CHOL/HDL Ratio: 4
Triglycerides: 150 mg/dL — ABNORMAL HIGH (ref 0.0–149.0)
VLDL: 30 mg/dL (ref 0.0–40.0)

## 2021-01-01 LAB — BASIC METABOLIC PANEL
BUN: 14 mg/dL (ref 6–23)
CO2: 30 mEq/L (ref 19–32)
Calcium: 9.4 mg/dL (ref 8.4–10.5)
Chloride: 103 mEq/L (ref 96–112)
Creatinine, Ser: 1.06 mg/dL (ref 0.40–1.50)
GFR: 76.36 mL/min (ref >60.00)
Glucose, Bld: 127 mg/dL — ABNORMAL HIGH (ref 70–99)
Potassium: 4.1 mEq/L (ref 3.5–5.1)
Sodium: 139 mEq/L (ref 135–145)

## 2021-01-01 LAB — CBC WITH DIFFERENTIAL/PLATELET
Basophils Absolute: 0 10*3/uL (ref 0.0–0.1)
Basophils Relative: 0.2 % (ref 0.0–3.0)
Eosinophils Absolute: 0.2 10*3/uL (ref 0.0–0.7)
Eosinophils Relative: 3.1 % (ref 0.0–5.0)
HCT: 43.3 % (ref 39.0–52.0)
Hemoglobin: 14.7 g/dL (ref 13.0–17.0)
Lymphocytes Relative: 24.5 % (ref 12.0–46.0)
Lymphs Abs: 1.7 10*3/uL (ref 0.7–4.0)
MCHC: 33.9 g/dL (ref 30.0–36.0)
MCV: 95.2 fl (ref 78.0–100.0)
Monocytes Absolute: 0.5 10*3/uL (ref 0.1–1.0)
Monocytes Relative: 7.3 % (ref 3.0–12.0)
Neutro Abs: 4.5 10*3/uL (ref 1.4–7.7)
Neutrophils Relative %: 64.9 % (ref 43.0–77.0)
Platelets: 272 10*3/uL (ref 150.0–400.0)
RBC: 4.55 Mil/uL (ref 4.22–5.81)
RDW: 15.6 % — ABNORMAL HIGH (ref 11.5–15.5)
WBC: 6.9 10*3/uL (ref 4.0–10.5)

## 2021-01-01 LAB — URINALYSIS, ROUTINE W REFLEX MICROSCOPIC
Bilirubin Urine: NEGATIVE
Hgb urine dipstick: NEGATIVE
Ketones, ur: NEGATIVE
Leukocytes,Ua: NEGATIVE
Nitrite: NEGATIVE
RBC / HPF: NONE SEEN (ref 0–?)
Specific Gravity, Urine: 1.01 (ref 1.000–1.030)
Total Protein, Urine: NEGATIVE
Urine Glucose: NEGATIVE
Urobilinogen, UA: 0.2 (ref 0.0–1.0)
WBC, UA: NONE SEEN (ref 0–?)
pH: 6 (ref 5.0–8.0)

## 2021-01-01 LAB — HEMOGLOBIN A1C: Hgb A1c MFr Bld: 5.4 % (ref 4.6–6.5)

## 2021-01-01 LAB — HEPATIC FUNCTION PANEL
ALT: 64 U/L — ABNORMAL HIGH (ref 0–53)
AST: 30 U/L (ref 0–37)
Albumin: 4.2 g/dL (ref 3.5–5.2)
Alkaline Phosphatase: 73 U/L (ref 39–117)
Bilirubin, Direct: 0.1 mg/dL (ref 0.0–0.3)
Total Bilirubin: 0.6 mg/dL (ref 0.2–1.2)
Total Protein: 6.4 g/dL (ref 6.0–8.3)

## 2021-01-01 LAB — PSA: PSA: 9.56 ng/mL — ABNORMAL HIGH (ref 0.10–4.00)

## 2021-01-01 LAB — TSH: TSH: 2.87 u[IU]/mL (ref 0.35–4.50)

## 2021-01-03 ENCOUNTER — Other Ambulatory Visit: Payer: Self-pay

## 2021-01-04 ENCOUNTER — Ambulatory Visit (INDEPENDENT_AMBULATORY_CARE_PROVIDER_SITE_OTHER): Payer: BC Managed Care – PPO | Admitting: Internal Medicine

## 2021-01-04 ENCOUNTER — Encounter: Payer: Self-pay | Admitting: Internal Medicine

## 2021-01-04 VITALS — BP 136/78 | HR 72 | Temp 98.2°F | Ht 73.0 in | Wt 210.0 lb

## 2021-01-04 DIAGNOSIS — E559 Vitamin D deficiency, unspecified: Secondary | ICD-10-CM | POA: Diagnosis not present

## 2021-01-04 DIAGNOSIS — F32A Depression, unspecified: Secondary | ICD-10-CM

## 2021-01-04 DIAGNOSIS — M545 Low back pain, unspecified: Secondary | ICD-10-CM

## 2021-01-04 DIAGNOSIS — R739 Hyperglycemia, unspecified: Secondary | ICD-10-CM

## 2021-01-04 DIAGNOSIS — E538 Deficiency of other specified B group vitamins: Secondary | ICD-10-CM

## 2021-01-04 DIAGNOSIS — E7849 Other hyperlipidemia: Secondary | ICD-10-CM

## 2021-01-04 DIAGNOSIS — I10 Essential (primary) hypertension: Secondary | ICD-10-CM

## 2021-01-04 DIAGNOSIS — Z0001 Encounter for general adult medical examination with abnormal findings: Secondary | ICD-10-CM | POA: Diagnosis not present

## 2021-01-04 DIAGNOSIS — R972 Elevated prostate specific antigen [PSA]: Secondary | ICD-10-CM

## 2021-01-04 DIAGNOSIS — G8929 Other chronic pain: Secondary | ICD-10-CM

## 2021-01-04 MED ORDER — SILDENAFIL CITRATE 100 MG PO TABS: 50.0000 mg | ORAL_TABLET | Freq: Every day | ORAL | 11 refills | Status: DC | PRN

## 2021-01-04 MED ORDER — HYDROCHLOROTHIAZIDE 12.5 MG PO CAPS: ORAL_CAPSULE | ORAL | 3 refills | Status: DC

## 2021-01-04 MED ORDER — MIRTAZAPINE 45 MG PO TABS: 45.0000 mg | ORAL_TABLET | Freq: Every day | ORAL | 3 refills | Status: DC

## 2021-01-04 MED ORDER — BENAZEPRIL HCL 40 MG PO TABS: 40.0000 mg | ORAL_TABLET | Freq: Every day | ORAL | 3 refills | Status: DC

## 2021-01-04 MED ORDER — BUPROPION HCL ER (XL) 300 MG PO TB24: 300.0000 mg | ORAL_TABLET | Freq: Every day | ORAL | 3 refills | Status: DC

## 2021-01-04 MED ORDER — CYCLOBENZAPRINE HCL 5 MG PO TABS: 5.0000 mg | ORAL_TABLET | Freq: Three times a day (TID) | ORAL | 1 refills | Status: DC | PRN

## 2021-01-04 MED ORDER — AMLODIPINE BESYLATE 5 MG PO TABS
5.0000 mg | ORAL_TABLET | Freq: Every day | ORAL | 3 refills | Status: DC
Start: 2021-01-04 — End: 2022-01-24

## 2021-01-04 MED ORDER — ATORVASTATIN CALCIUM 40 MG PO TABS: 40.0000 mg | ORAL_TABLET | Freq: Every day | ORAL | 3 refills | Status: DC

## 2021-01-04 NOTE — Progress Notes (Signed)
Patient ID: Fred [name], male   DOB: 04-May-1961, 60 y.o.   MRN: [id number]          Chief Complaint:: wellness exam and f/u HLD, hyperglycemia, HTN, and acute on chronic LBP        HPI:  Fred [name] is a 60 y.o. male here for wellness exam: BP has been on average about 130.84 at home.  Did have colonoscopy 2018 with 2 polyps, and plan to f/u at 5 yrs with Dr Fuller Plan.  Goal wt is 185, after gaining about 20 lbs in last 2 yrs. Now seeing nutritinist recently, Has been a low of 195  Most recently, but admits to dietary excess related to stressors with wt gain, business very busy., plans to do better with diet and activity    Wt Readings from Last 3 Encounters:  01/04/21 210 lb (95.3 kg)  04/12/20 204 lb (92.5 kg)  12/30/19 209 lb (94.8 kg)   BP Readings from Last 3 Encounters:  01/04/21 136/78  04/12/20 (!) 160/118  12/30/19 (!) 146/100   Immunization History  Administered Date(s) Administered  . Influenza Inj Mdck Quad Pf 09/08/2017  . Influenza Split 10/08/2011, 08/25/2012  . Influenza Whole 09/09/2008, 09/01/2009  . Influenza, Seasonal, Injecte, Preservative Fre 07/26/2013  . Influenza,inj,Quad PF,6+ Mos 09/01/2018  . Influenza-Unspecified 10/25/2014, 09/25/2015, 08/25/2016, 09/11/2019, 09/14/2020  . PFIZER(Purple Top)SARS-COV-2 Vaccination 02/17/2020, 03/13/2020, 10/31/2020  . Td 09/01/2009  . Tdap 12/30/2019   There are no preventive care reminders to display for this patient.       Also Pt continues to have recurring LBP now worse x 1 wk to the right lower back, without bowel or bladder change, fever, wt loss,  worsening LE pain/numbness/weakness, gait change or falls.  Pt denies chest pain, increased sob or doe, wheezing, orthopnea, PND, increased LE swelling, palpitations, dizziness or syncope.  Trying to follow low chol diet, and is interested in Ct Cardiac scoring.  Needs refills. Denies urinary symptoms such as dysuria, frequency, urgency, flank pain, hematuria or n/v,  fever, chills.   Past Medical History:  Diagnosis Date  . ALCOHOL ABUSE, EPISODIC, HX OF 02/15/2008  . ALLERGIC RHINITIS 02/15/2008  . DEPRESSION 07/28/2007  . HEARING LOSS 08/05/2008  . HYPERLIPIDEMIA 02/11/2008  . HYPERTENSION 07/28/2007  . PSA, INCREASED 10/01/2010  . TRANSAMINASES, SERUM, ELEVATED 09/01/2009  . Tubular adenoma of colon 10/2010   Past Surgical History:  Procedure Laterality Date  . COLONOSCOPY    . inguinal herniorrhapy  1976  . KNEE ARTHROSCOPY, MEDIAL PATELLO FEMORAL LIGAMENT RECONSTRUCTION W/ HAMSTRING GRAFT Left 2012   They cleaned cartilege out of my knee  . left foot surgery  08/2007  . POLYPECTOMY      reports that he has never smoked. He has never used smokeless tobacco. He reports that he does not drink alcohol and does not use drugs. family history includes Alcohol abuse in his mother; Colon polyps in his maternal grandmother; Depression in his mother; Hypertension in his father; Prostate cancer in his father. No Known Allergies Current Outpatient Medications on File Prior to Visit  Medication Sig Dispense Refill  . aspirin EC 81 MG tablet Take 1 tablet (81 mg total) by mouth daily. 90 tablet 11  . loratadine (CLARITIN) 10 MG tablet Take 10 mg by mouth daily.    Marland Kitchen dicyclomine (BENTYL) 10 MG capsule Take 1 capsule (10 mg total) by mouth 3 (three) times daily before meals. 90 capsule 11   No current facility-administered medications on file  prior to visit.        ROS:  All others reviewed and negative.  Objective        PE:  BP 136/78   Pulse 72   Temp 98.2 F (36.8 C) (Oral)   Ht 6\' 1"  (1.854 m)   Wt 210 lb (95.3 kg)   SpO2 98%   BMI 27.71 kg/m                 Constitutional: Pt appears in NAD               HENT: Head: NCAT.                Right Ear: External ear normal.                 Left Ear: External ear normal.                Eyes: . Pupils are equal, round, and reactive to light. Conjunctivae and EOM are normal               Nose:  without d/c or deformity               Neck: Neck supple. Gross normal ROM               Cardiovascular: Normal rate and regular rhythm.                 Pulmonary/Chest: Effort normal and breath sounds without rales or wheezing.                Abd:  Soft, NT, ND, + BS, no organomegaly                Spine nontender in midline, has mild to mod right lower lumbar spasm tender               Neurological: Pt is alert. At baseline orientation, motor grossly intact               Skin: Skin is warm. No rashes, no other new lesions, LE edema - none               Psychiatric: Pt behavior is normal without agitation   Micro: none  Cardiac tracings I have personally interpreted today:  none  Pertinent Radiological findings (summarize): none   Lab Results  Component Value Date   WBC 6.9 01/01/2021   HGB 14.7 01/01/2021   HCT 43.3 01/01/2021   PLT 272.0 01/01/2021   GLUCOSE 127 (H) 01/01/2021   CHOL 140 01/01/2021   TRIG 150.0 (H) 01/01/2021   HDL 37.50 (L) 01/01/2021   LDLDIRECT 76.0 12/19/2017   LDLCALC 73 01/01/2021   ALT 64 (H) 01/01/2021   AST 30 01/01/2021   NA 139 01/01/2021   K 4.1 01/01/2021   CL 103 01/01/2021   CREATININE 1.06 01/01/2021   BUN 14 01/01/2021   CO2 30 01/01/2021   TSH 2.87 01/01/2021   PSA 9.56 (H) 01/01/2021   INR 1.0 08/07/2017   HGBA1C 5.4 01/01/2021   Assessment/Plan:  Fred [name] is a 60 y.o. White or Caucasian [1] male with  has a past medical history of ALCOHOL ABUSE, EPISODIC, HX OF (02/15/2008), ALLERGIC RHINITIS (02/15/2008), DEPRESSION (07/28/2007), HEARING LOSS (08/05/2008), HYPERLIPIDEMIA (02/11/2008), HYPERTENSION (07/28/2007), PSA, INCREASED (10/01/2010), TRANSAMINASES, SERUM, ELEVATED (09/01/2009), and Tubular adenoma of colon (10/2010). Encounter for well adult exam with abnormal findings Age and sex appropriate education and counseling updated with  regular exercise and diet Referrals for preventative services - none needed Immunizations addressed  - none needed Smoking counseling  - none needed Evidence for depression or other mood disorder - none significant Most recent labs reviewed. I have personally reviewed and have noted: 1) the patient's medical and social history 2) The patient's current medications and supplements 3) The patient's height, weight, and BMI have been recorded in the chart   PSA, INCREASED For f/u psa, asympt  Depression Stable overall   Cont wellbutrin, remeron,   Current Outpatient Medications (Cardiovascular):  .  amLODipine (NORVASC) 5 MG tablet, Take 1 tablet (5 mg total) by mouth daily. Marland Kitchen  atorvastatin (LIPITOR) 40 MG tablet, Take 1 tablet (40 mg total) by mouth daily. .  benazepril (LOTENSIN) 40 MG tablet, Take 1 tablet (40 mg total) by mouth daily. .  hydrochlorothiazide (MICROZIDE) 12.5 MG capsule, TAKE 1 CAPSULE BY MOUTH EVERY DAY .  sildenafil (VIAGRA) 100 MG tablet, Take 0.5-1 tablets (50-100 mg total) by mouth daily as needed for erectile dysfunction.  Current Outpatient Medications (Respiratory):  .  loratadine (CLARITIN) 10 MG tablet, Take 10 mg by mouth daily.  Current Outpatient Medications (Analgesics):  .  aspirin EC 81 MG tablet, Take 1 tablet (81 mg total) by mouth daily.   Current Outpatient Medications (Other):  .  cyclobenzaprine (FLEXERIL) 5 MG tablet, Take 1 tablet (5 mg total) by mouth 3 (three) times daily as needed for muscle spasms. Marland Kitchen  buPROPion (WELLBUTRIN XL) 300 MG 24 hr tablet, Take 1 tablet (300 mg total) by mouth daily. Marland Kitchen  dicyclomine (BENTYL) 10 MG capsule, Take 1 capsule (10 mg total) by mouth 3 (three) times daily before meals. .  mirtazapine (REMERON) 45 MG tablet, Take 1 tablet (45 mg total) by mouth at bedtime.   Essential hypertension BP Readings from Last 3 Encounters:  01/04/21 136/78  04/12/20 (!) 160/118  12/30/19 (!) 146/100   Stable, pt to continue medical treatment  - norvasc   HLD (hyperlipidemia) Lab Results  Component Value Date    LDLCALC 73 01/01/2021   Stable, pt to continue current statin  - lipitor 40, for ct cardiac score   Hyperglycemia Lab Results  Component Value Date   LDLCALC 73 01/01/2021   Stable, pt to continue current statin  - diet   Low back pain Mild acute on chronic, for flexeril prn,  to f/u any worsening symptoms or concerns  Followup: Return in about 1 year (around 01/04/2022).  Cathlean Cower, MD 01/10/2021 8:10 PM [facility]

## 2021-01-04 NOTE — Patient Instructions (Signed)
Please take all new medication as prescribed - the muscle relaxer as needed  We have discussed the Cardiac CT Score test to measure the calcification level (if any) in your heart arteries.  This test has been ordered in our [facility], so please call [facility] directly, as they prefer this, at [phone] to be scheduled.  Please continue all other medications as before, and refills have been done if requested.  Please have the pharmacy call with any other refills you may need.  Please continue your efforts at being more active, low cholesterol diet, and weight control.  You are otherwise up to date with prevention measures today.  Please keep your appointments with your specialists as you may have planned  Please make an Appointment to return for your 1 year visit, or sooner if needed, with Lab testing by Appointment as well, to be done about 3-5 days before at the [facility] (so this is for TWO appointments - please see the scheduling desk as you leave)  Due to the ongoing Covid 19 pandemic, our lab now requires an appointment for any labs done at our office.  If you need labs done and do not have an appointment, please call our office ahead of time to schedule before presenting to the lab for your testing.

## 2021-01-10 ENCOUNTER — Encounter: Payer: Self-pay | Admitting: Internal Medicine

## 2021-01-10 NOTE — Assessment & Plan Note (Addendum)
Lab Results  Component Value Date   LDLCALC 73 01/01/2021   Stable, pt to continue current statin  - lipitor 40, for ct cardiac score

## 2021-01-10 NOTE — Assessment & Plan Note (Signed)
BP Readings from Last 3 Encounters:  01/04/21 136/78  04/12/20 (!) 160/118  12/30/19 (!) 146/100   Stable, pt to continue medical treatment  - norvasc

## 2021-01-10 NOTE — Assessment & Plan Note (Signed)
For f/u psa, asympt 

## 2021-01-10 NOTE — Assessment & Plan Note (Signed)

## 2021-01-10 NOTE — Assessment & Plan Note (Signed)
Lab Results  Component Value Date   LDLCALC 73 01/01/2021   Stable, pt to continue current statin  - diet

## 2021-01-10 NOTE — Assessment & Plan Note (Signed)
Mild acute on chronic, for flexeril prn,  to f/u any worsening symptoms or concerns

## 2021-01-10 NOTE — Assessment & Plan Note (Signed)
Stable overall   Cont wellbutrin, remeron,   Current Outpatient Medications (Cardiovascular):  .  amLODipine (NORVASC) 5 MG tablet, Take 1 tablet (5 mg total) by mouth daily. Marland Kitchen  atorvastatin (LIPITOR) 40 MG tablet, Take 1 tablet (40 mg total) by mouth daily. .  benazepril (LOTENSIN) 40 MG tablet, Take 1 tablet (40 mg total) by mouth daily. .  hydrochlorothiazide (MICROZIDE) 12.5 MG capsule, TAKE 1 CAPSULE BY MOUTH EVERY DAY .  sildenafil (VIAGRA) 100 MG tablet, Take 0.5-1 tablets (50-100 mg total) by mouth daily as needed for erectile dysfunction.  Current Outpatient Medications (Respiratory):  .  loratadine (CLARITIN) 10 MG tablet, Take 10 mg by mouth daily.  Current Outpatient Medications (Analgesics):  .  aspirin EC 81 MG tablet, Take 1 tablet (81 mg total) by mouth daily.   Current Outpatient Medications (Other):  .  cyclobenzaprine (FLEXERIL) 5 MG tablet, Take 1 tablet (5 mg total) by mouth 3 (three) times daily as needed for muscle spasms. Marland Kitchen  buPROPion (WELLBUTRIN XL) 300 MG 24 hr tablet, Take 1 tablet (300 mg total) by mouth daily. Marland Kitchen  dicyclomine (BENTYL) 10 MG capsule, Take 1 capsule (10 mg total) by mouth 3 (three) times daily before meals. .  mirtazapine (REMERON) 45 MG tablet, Take 1 tablet (45 mg total) by mouth at bedtime.

## 2021-02-09 ENCOUNTER — Ambulatory Visit (INDEPENDENT_AMBULATORY_CARE_PROVIDER_SITE_OTHER)
Admission: RE | Admit: 2021-02-09 | Discharge: 2021-02-09 | Disposition: A | Discharge status: Home / Self Care | Payer: Self-pay | Source: Ambulatory Visit | Attending: Internal Medicine | Admitting: Internal Medicine

## 2021-02-09 ENCOUNTER — Other Ambulatory Visit: Payer: Self-pay

## 2021-02-09 DIAGNOSIS — E7849 Other hyperlipidemia: Secondary | ICD-10-CM

## 2021-02-09 DIAGNOSIS — R739 Hyperglycemia, unspecified: Secondary | ICD-10-CM

## 2021-02-09 DIAGNOSIS — I10 Essential (primary) hypertension: Secondary | ICD-10-CM

## 2021-02-10 ENCOUNTER — Encounter: Payer: Self-pay | Admitting: Internal Medicine

## 2021-02-10 ENCOUNTER — Other Ambulatory Visit: Payer: Self-pay | Admitting: Internal Medicine

## 2021-02-10 DIAGNOSIS — I2510 Atherosclerotic heart disease of native coronary artery without angina pectoris: Secondary | ICD-10-CM

## 2021-03-15 DIAGNOSIS — M546 Pain in thoracic spine: Secondary | ICD-10-CM | POA: Diagnosis not present

## 2021-03-27 DIAGNOSIS — N401 Enlarged prostate with lower urinary tract symptoms: Secondary | ICD-10-CM | POA: Diagnosis not present

## 2021-03-27 DIAGNOSIS — R351 Nocturia: Secondary | ICD-10-CM | POA: Diagnosis not present

## 2021-04-03 DIAGNOSIS — R351 Nocturia: Secondary | ICD-10-CM | POA: Diagnosis not present

## 2021-04-03 DIAGNOSIS — N401 Enlarged prostate with lower urinary tract symptoms: Secondary | ICD-10-CM | POA: Diagnosis not present

## 2021-04-03 DIAGNOSIS — Z125 Encounter for screening for malignant neoplasm of prostate: Secondary | ICD-10-CM | POA: Diagnosis not present

## 2021-04-03 DIAGNOSIS — R972 Elevated prostate specific antigen [PSA]: Secondary | ICD-10-CM | POA: Diagnosis not present

## 2021-04-09 DIAGNOSIS — M546 Pain in thoracic spine: Secondary | ICD-10-CM | POA: Diagnosis not present

## 2021-04-13 ENCOUNTER — Ambulatory Visit (INDEPENDENT_AMBULATORY_CARE_PROVIDER_SITE_OTHER): Payer: BC Managed Care – PPO | Admitting: Cardiology

## 2021-04-13 ENCOUNTER — Encounter: Payer: Self-pay | Admitting: Cardiology

## 2021-04-13 ENCOUNTER — Other Ambulatory Visit: Payer: Self-pay

## 2021-04-13 VITALS — BP 130/80 | HR 65 | Ht 73.0 in | Wt 202.0 lb

## 2021-04-13 DIAGNOSIS — I2510 Atherosclerotic heart disease of native coronary artery without angina pectoris: Secondary | ICD-10-CM

## 2021-04-13 DIAGNOSIS — I2584 Coronary atherosclerosis due to calcified coronary lesion: Secondary | ICD-10-CM | POA: Diagnosis not present

## 2021-04-13 DIAGNOSIS — E7800 Pure hypercholesterolemia, unspecified: Secondary | ICD-10-CM

## 2021-04-13 DIAGNOSIS — I10 Essential (primary) hypertension: Secondary | ICD-10-CM

## 2021-04-13 NOTE — Patient Instructions (Signed)
Medication Instructions:  The current medical regimen is effective;  continue present plan and medications.  *If you need a refill on your cardiac medications before your next appointment, please call your pharmacy*  Lab Work: Please have blood work in 3 months (lipid)  If you have labs (blood work) drawn today and your tests are completely normal, you will receive your results only by: Marland Kitchen MyChart Message (if you have MyChart) OR . A paper copy in the mail If you have any lab test that is abnormal or we need to change your treatment, we will call you to review the results.  Follow-Up: At [facility], you and your health needs are our priority.  As part of our continuing mission to provide you with exceptional heart care, we have created designated Provider Care Teams.  These Care Teams include your primary Cardiologist (physician) and Advanced Practice Providers (APPs -  Physician Assistants and Nurse Practitioners) who all work together to provide you with the care you need, when you need it.  We recommend signing up for the patient portal called "MyChart".  Sign up information is provided on this After Visit Summary.  MyChart is used to connect with patients for Virtual Visits (Telemedicine).  Patients are able to view lab/test results, encounter notes, upcoming appointments, etc.  Non-urgent messages can be sent to your provider as well.   To learn more about what you can do with MyChart, go to [email or web address].    Your next appointment:   12 month(s)  The format for your next appointment:   In Person  Provider:   Candee Furbish, MD  Thank you for choosing [facility]!!

## 2021-04-13 NOTE — Progress Notes (Signed)
Cardiology Office Note:    Date:  04/13/2021   ID:  ENNIS HEAVNER, DOB 1961-08-21, MRN [id number]  PCP:  Biagio Borg, MD   [facility] Providers Cardiologist:  Candee Furbish, MD     Referring MD: Biagio Borg, MD    History of Present Illness:    Fred [name] is a 60 y.o. male here for the evaluation of coronary artery calcification at the request of Dr. Cathlean Cower. His father the had atrial fibrillation and AAA abdominal aortic anyuresum. His father was a smoker. He is tolerating his medications well. He started seeing nutritionist 2 month ago and he has lost weight and his goal is to lose another 15 lbs.   Wt Readings from Last 3 Encounters:  04/13/21 202 lb (91.6 kg)  01/04/21 210 lb (95.3 kg)  04/12/20 204 lb (92.5 kg)   He eating less meat products and more vegetables. He is physically active a by walking more frequently. He denies any exertional shortness of breath, chest pain, tightness, or pressure He denies any lightheadedness, PND, syncopal episodes, or orthopnea. He works as Patent attorney. He is not a smoker.     LDL 73   Past Medical History:  Diagnosis Date  . ALCOHOL ABUSE, EPISODIC, HX OF 02/15/2008  . ALLERGIC RHINITIS 02/15/2008  . DEPRESSION 07/28/2007  . HEARING LOSS 08/05/2008  . HYPERLIPIDEMIA 02/11/2008  . HYPERTENSION 07/28/2007  . PSA, INCREASED 10/01/2010  . TRANSAMINASES, SERUM, ELEVATED 09/01/2009  . Tubular adenoma of colon 10/2010    Past Surgical History:  Procedure Laterality Date  . COLONOSCOPY    . inguinal herniorrhapy  1976  . KNEE ARTHROSCOPY, MEDIAL PATELLO FEMORAL LIGAMENT RECONSTRUCTION W/ HAMSTRING GRAFT Left 2012   They cleaned cartilege out of my knee  . left foot surgery  08/2007  . POLYPECTOMY      Current Medications: Current Meds  Medication Sig  . amLODipine (NORVASC) 5 MG tablet Take 1 tablet (5 mg total) by mouth daily.  Marland Kitchen aspirin EC 81 MG tablet Take 1 tablet (81 mg total) by mouth daily.  Marland Kitchen atorvastatin  (LIPITOR) 40 MG tablet Take 1 tablet (40 mg total) by mouth daily.  . benazepril (LOTENSIN) 40 MG tablet Take 1 tablet (40 mg total) by mouth daily.  Marland Kitchen buPROPion (WELLBUTRIN XL) 300 MG 24 hr tablet Take 1 tablet (300 mg total) by mouth daily.  . hydrochlorothiazide (MICROZIDE) 12.5 MG capsule TAKE 1 CAPSULE BY MOUTH EVERY DAY  . loratadine (CLARITIN) 10 MG tablet Take 10 mg by mouth daily.  . mirtazapine (REMERON) 45 MG tablet Take 1 tablet (45 mg total) by mouth at bedtime.  . naproxen (NAPROSYN) 500 MG tablet Take 500 mg by mouth 2 (two) times daily with a meal.  . sildenafil (VIAGRA) 100 MG tablet Take 0.5-1 tablets (50-100 mg total) by mouth daily as needed for erectile dysfunction.     Allergies:   Patient has no known allergies.   Social History   Socioeconomic History  . Marital status: Single    Spouse name: Not on file  . Number of children: Not on file  . Years of education: Not on file  . Highest education level: Not on file  Occupational History  . Occupation: Warehouse manager: [employer]  Tobacco Use  . Smoking status: Never Smoker  . Smokeless tobacco: Never Used  Vaping Use  . Vaping Use: Never used  Substance and Sexual Activity  .  Alcohol use: No  . Drug use: No  . Sexual activity: Yes    Partners: Female  Other Topics Concern  . Not on file  Social History Narrative  . Not on file   Social Determinants of Health   Financial Resource Strain: Not on file  Food Insecurity: Not on file  Transportation Needs: Not on file  Physical Activity: Not on file  Stress: Not on file  Social Connections: Not on file     Family History: The patient'sfamily history includes Alcohol abuse in his mother; Colon polyps in his maternal grandmother; Depression in his mother; Hypertension in his father; Prostate cancer in his father. There is no history of Rectal cancer, Stomach cancer, or Esophageal cancer.  ROS:   Please see the history  of present illness. All other systems reviewed and are negative.  EKGs/Labs/Other Studies Reviewed:    The following studies were reviewed today:   Echo 06/11/2007: SUMMARY  - Overall left ventricular systolic function was normal. Left     ventricular ejection fraction was estimated , range being 55  % to 60 %. There were no left ventricular regional wall     motion abnormalities. Features were consistent with mild     diastolic dysfunction.   Abdominal Aortic Aneurysm Duplex 10/13/21: Impression:  Normal caliber abdominal aorta, common and external illaac arteries, without focal stenosis or dialation    CT calcium score   FINDINGS: Non-cardiac: See separate report from [facility].  Ascending aorta: Upper normal (38 mm  Pericardium: Normal  Coronary arteries: Normal origin  IMPRESSION: Coronary calcium score of 195. This was 47 percentile for age and sex matched control.   EKG:   04/13/21-Sinus rythym,, rate 65, RBBB, LAFB  Recent Labs: 01/01/2021: ALT 64; BUN 14; Creatinine, Ser 1.06; Hemoglobin 14.7; Platelets 272.0; Potassium 4.1; Sodium 139; TSH 2.87  Recent Lipid Panel    Component Value Date/Time   CHOL 140 01/01/2021 0747   TRIG 150.0 (H) 01/01/2021 0747   HDL 37.50 (L) 01/01/2021 0747   CHOLHDL 4 01/01/2021 0747   VLDL 30.0 01/01/2021 0747   LDLCALC 73 01/01/2021 0747   LDLDIRECT 76.0 12/19/2017 1003    Physical Exam:    VS:  BP 130/80 (BP Location: Left Arm, Patient Position: Sitting, Cuff Size: Normal)   Pulse 65   Ht 6\' 1"  (1.854 m)   Wt 202 lb (91.6 kg)   SpO2 98%   BMI 26.65 kg/m     Wt Readings from Last 3 Encounters:  04/13/21 202 lb (91.6 kg)  01/04/21 210 lb (95.3 kg)  04/12/20 204 lb (92.5 kg)     GEN: Well nourished, well developed in no acute distress HEENT: Normal NECK: No JVD; No carotid bruits LYMPHATICS: No lymphadenopathy CARDIAC: RRR, no murmurs, rubs, gallops RESPIRATORY:  Clear to auscultation  without rales, wheezing or rhonchi  ABDOMEN: Soft, non-tender, non-distended MUSCULOSKELETAL:  No edema; No deformity  SKIN: Warm and dry NEUROLOGIC:  Alert and oriented x 3 PSYCHIATRIC:  Normal affect   ASSESSMENT:    1. Coronary artery calcification   2. Pure hypercholesterolemia   3. Essential hypertension    PLAN:    In order of problems listed above:  Coronary artery calcification - Based upon his findings, continue with atorvastatin 40 mg once a day high intensity dose.  LDL goal should be less than 70.  Currently his LDL is 73.  Continue to monitor this closely.  Since his score was less than 400, no further testing  is warranted i.e. nuclear stress test or pharmacologic stress testing. -Personally showed him and interpreted his coronary calcium scan. -He is doing very well with a new diet, lost 11 pounds.  Avoiding meat. -Walking daily as well at the request of Dr. Jenny Reichmann.  Excellent. -I will go ahead and check a lipid panel in 3 months.  If his LDL is less than 70 we will continue with current therapy.  If not, we will intensify his atorvastatin to 80 mg. -Continue with aspirin 81 mg.  Essential hypertension - Continue with amlodipine as well as benazepril.  Excellent control.  Continue with current medical management.  Refills as needed.  Family history Father had atrial fibrillation as well as AAA.  He used to smoke.  1 year follow-up     Medication Adjustments/Labs and Tests Ordered: Current medicines are reviewed at length with the patient today.  Concerns regarding medicines are outlined above.  Orders Placed This Encounter  Procedures  . Lipid panel  . EKG 12-Lead   No orders of the defined types were placed in this encounter.   Patient Instructions  Medication Instructions:  The current medical regimen is effective;  continue present plan and medications.  *If you need a refill on your cardiac medications before your next appointment, please call your  pharmacy*  Lab Work: Please have blood work in 3 months (lipid)  If you have labs (blood work) drawn today and your tests are completely normal, you will receive your results only by: Marland Kitchen MyChart Message (if you have MyChart) OR . A paper copy in the mail If you have any lab test that is abnormal or we need to change your treatment, we will call you to review the results.  Follow-Up: At [facility], you and your health needs are our priority.  As part of our continuing mission to provide you with exceptional heart care, we have created designated Provider Care Teams.  These Care Teams include your primary Cardiologist (physician) and Advanced Practice Providers (APPs -  Physician Assistants and Nurse Practitioners) who all work together to provide you with the care you need, when you need it.  We recommend signing up for the patient portal called "MyChart".  Sign up information is provided on this After Visit Summary.  MyChart is used to connect with patients for Virtual Visits (Telemedicine).  Patients are able to view lab/test results, encounter notes, upcoming appointments, etc.  Non-urgent messages can be sent to your provider as well.   To learn more about what you can do with MyChart, go to [email or web address].    Your next appointment:   12 month(s)  The format for your next appointment:   In Person  Provider:   Candee Furbish, MD  Thank you for choosing [facility]!!         I,Alexis Bryant,acting as a scribe for Candee Furbish, MD.,have documented all relevant documentation on the behalf of Candee Furbish, MD,as directed by  Candee Furbish, MD while in the presence of Candee Furbish, MD.  I, Candee Furbish, MD, have reviewed all documentation for this visit. The documentation on 04/13/21 for the exam, diagnosis, procedures, and orders are all accurate and complete. Signed, Candee Furbish, MD  04/13/2021 9:32 AM    [facility]

## 2021-04-16 DIAGNOSIS — M546 Pain in thoracic spine: Secondary | ICD-10-CM | POA: Diagnosis not present

## 2021-04-16 DIAGNOSIS — M545 Low back pain, unspecified: Secondary | ICD-10-CM | POA: Diagnosis not present

## 2021-04-24 DIAGNOSIS — H524 Presbyopia: Secondary | ICD-10-CM | POA: Diagnosis not present

## 2021-05-10 DIAGNOSIS — M5134 Other intervertebral disc degeneration, thoracic region: Secondary | ICD-10-CM | POA: Diagnosis not present

## 2021-05-18 DIAGNOSIS — M5414 Radiculopathy, thoracic region: Secondary | ICD-10-CM | POA: Insufficient documentation

## 2021-05-29 DIAGNOSIS — M5414 Radiculopathy, thoracic region: Secondary | ICD-10-CM | POA: Diagnosis not present

## 2021-06-21 DIAGNOSIS — M62830 Muscle spasm of back: Secondary | ICD-10-CM | POA: Diagnosis not present

## 2021-06-21 DIAGNOSIS — M5414 Radiculopathy, thoracic region: Secondary | ICD-10-CM | POA: Diagnosis not present

## 2021-07-18 ENCOUNTER — Other Ambulatory Visit: Payer: BC Managed Care – PPO

## 2021-07-31 ENCOUNTER — Other Ambulatory Visit: Payer: Self-pay

## 2021-07-31 ENCOUNTER — Other Ambulatory Visit: Payer: BC Managed Care – PPO | Admitting: *Deleted

## 2021-07-31 DIAGNOSIS — E7800 Pure hypercholesterolemia, unspecified: Secondary | ICD-10-CM

## 2021-07-31 LAB — LIPID PANEL
Chol/HDL Ratio: 2.9 ratio (ref 0.0–5.0)
Cholesterol, Total: 125 mg/dL (ref 100–199)
HDL: 43 mg/dL (ref >39)
LDL Chol Calc (NIH): 63 mg/dL (ref 0–99)
Triglycerides: 101 mg/dL (ref 0–149)
VLDL Cholesterol Cal: 19 mg/dL (ref 5–40)

## 2021-08-21 ENCOUNTER — Encounter: Payer: Self-pay | Admitting: Gastroenterology

## 2021-08-28 DIAGNOSIS — M546 Pain in thoracic spine: Secondary | ICD-10-CM | POA: Diagnosis not present

## 2021-09-17 ENCOUNTER — Encounter: Payer: Self-pay | Admitting: Gastroenterology

## 2021-09-17 ENCOUNTER — Ambulatory Visit (INDEPENDENT_AMBULATORY_CARE_PROVIDER_SITE_OTHER): Payer: BC Managed Care – PPO | Admitting: Gastroenterology

## 2021-09-17 VITALS — BP 136/86 | HR 72 | Ht 72.0 in | Wt 202.5 lb

## 2021-09-17 DIAGNOSIS — Z86010 Personal history of colonic polyps: Secondary | ICD-10-CM | POA: Diagnosis not present

## 2021-09-17 DIAGNOSIS — Z800 Family history of malignant neoplasm of digestive organs: Secondary | ICD-10-CM | POA: Diagnosis not present

## 2021-09-17 MED ORDER — NA SULFATE-K SULFATE-MG SULF 17.5-3.13-1.6 GM/177ML PO SOLN: 1.0000 | Freq: Once | ORAL | 0 refills | Status: AC

## 2021-09-17 NOTE — Progress Notes (Signed)
History of Present Illness: This is a 60 year old male referred by Fred Borg, MD for the evaluation of personal history of adenomatous colon polyps, family history of colon polyps and strong family history of colon cancer.  His 12 year old brother was recently diagnosed with rectal cancer and is currently undergoing treatment.  His 4 year old sister has a history of precancerous colon polyps as it and is on a 3-year interval surveillance.  His father and a paternal grandmother both had colon cancer.  He underwent colonoscopy in December 2018 showing 2 small polyps (1 was adenomatous) and internal hemorrhoids.  Colonoscopy was complete to the cecum with an excellent bowel prep. Denies weight loss, abdominal pain, constipation, diarrhea, change in stool caliber, melena, hematochezia, nausea, vomiting, dysphagia, reflux symptoms, chest pain.    No Known Allergies Outpatient Medications Prior to Visit  Medication Sig Dispense Refill   amLODipine (NORVASC) 5 MG tablet Take 1 tablet (5 mg total) by mouth daily. 90 tablet 3   aspirin 81 MG chewable tablet Chew 1 tablet by mouth daily.     atorvastatin (LIPITOR) 40 MG tablet Take 1 tablet (40 mg total) by mouth daily. 90 tablet 3   benazepril (LOTENSIN) 40 MG tablet Take 1 tablet (40 mg total) by mouth daily. 90 tablet 3   buPROPion (WELLBUTRIN XL) 300 MG 24 hr tablet Take 1 tablet (300 mg total) by mouth daily. 90 tablet 3   hydrochlorothiazide (MICROZIDE) 12.5 MG capsule TAKE 1 CAPSULE BY MOUTH EVERY DAY 90 capsule 3   loratadine (CLARITIN) 10 MG tablet Take 10 mg by mouth daily.     methocarbamol (ROBAXIN) 500 MG tablet Take 500 mg by mouth at bedtime.     mirtazapine (REMERON) 45 MG tablet Take 1 tablet (45 mg total) by mouth at bedtime. 90 tablet 3   naproxen (NAPROSYN) 500 MG tablet Take 500 mg by mouth as needed.     sildenafil (VIAGRA) 100 MG tablet Take 0.5-1 tablets (50-100 mg total) by mouth daily as needed for erectile dysfunction. 10  tablet 11   traMADol (ULTRAM) 50 MG tablet Take 50 mg by mouth as needed.     aspirin EC 81 MG tablet Take 1 tablet (81 mg total) by mouth daily. 90 tablet 11   No facility-administered medications prior to visit.   Past Medical History:  Diagnosis Date   ALCOHOL ABUSE, EPISODIC, HX OF 02/15/2008   ALLERGIC RHINITIS 02/15/2008   DEPRESSION 07/28/2007   HEARING LOSS 08/05/2008   HYPERLIPIDEMIA 02/11/2008   HYPERTENSION 07/28/2007   PSA, INCREASED 10/01/2010   TRANSAMINASES, SERUM, ELEVATED 09/01/2009   Tubular adenoma of colon 10/2010   Past Surgical History:  Procedure Laterality Date   COLONOSCOPY     FOOT SURGERY Right    from injury   inguinal herniorrhapy Bilateral 1976   KNEE ARTHROSCOPY, MEDIAL PATELLO FEMORAL LIGAMENT RECONSTRUCTION W/ HAMSTRING GRAFT Left 2012   They cleaned cartilege out of my knee   left foot surgery  08/2007   POLYPECTOMY     Social History   Socioeconomic History   Marital status: Single    Spouse name: Not on file   Number of children: 0   Years of education: Not on file   Highest education level: Not on file  Occupational History   Occupation: Editor, commissioning: Building surveyor [employer]  Tobacco Use   Smoking status: Never   Smokeless tobacco: Never  Vaping Use   Vaping Use: Never  used  Substance and Sexual Activity   Alcohol use: No   Drug use: No   Sexual activity: Yes    Partners: Female  Other Topics Concern   Not on file  Social History Narrative   Not on file   Social Determinants of Health   Financial Resource Strain: Not on file  Food Insecurity: Not on file  Transportation Needs: Not on file  Physical Activity: Not on file  Stress: Not on file  Social Connections: Not on file   Family History  Problem Relation Age of Onset   Depression Mother    Alcohol abuse Mother    Hypertension Father    Prostate cancer Father    Colon cancer Father    Kidney disease Father    Colon polyps Sister    Colon  cancer Brother 27   Colon cancer Paternal Grandmother    Rectal cancer Neg Hx    Stomach cancer Neg Hx    Esophageal cancer Neg Hx        Review of Systems: Pertinent positive and negative review of systems were noted in the above HPI section. All other review of systems were otherwise negative.    Physical Exam: General: Well developed, well nourished, no acute distress Head: Normocephalic and atraumatic Eyes: Sclerae anicteric, EOMI Ears: Normal auditory acuity Mouth: Not examined, mask on during Covid-19 pandemic Neck: Supple, no masses or thyromegaly Lungs: Clear throughout to auscultation Heart: Regular rate and rhythm; no murmurs, rubs or bruits Abdomen: Soft, non tender and non distended. No masses, hepatosplenomegaly or hernias noted. Normal Bowel sounds Rectal: Deferred to colonoscopy Musculoskeletal: Symmetrical with no gross deformities  Skin: No lesions on visible extremities Pulses:  Normal pulses noted Extremities: No clubbing, cyanosis, edema or deformities noted Neurological: Alert oriented x 4, grossly nonfocal Cervical Nodes:  No significant cervical adenopathy Inguinal Nodes: No significant inguinal adenopathy Psychological:  Alert and cooperative. Normal mood and affect   Assessment and Recommendations:  Personal history of adenomatous colon polyps. Family history of colon polyps, sister. Strong family history of colon cancer, brother 16, father and PGM.  Advised colonoscopy is reasonable at the 3 to 5-year interval from his last colonoscopy.  We had previously recommended a 5-year interval prior to his younger brother being diagnosed with rectal cancer.  He would like to proceed with colonoscopy at this time.  Schedule colonoscopy. The risks (including bleeding, perforation, infection, missed lesions, medication reactions and possible hospitalization or surgery if complications occur), benefits, and alternatives to colonoscopy with possible biopsy and possible  polypectomy were discussed with the patient and they consent to proceed.     cc: Fred Borg, MD [id number] [place],  [place] [id number]

## 2021-09-17 NOTE — Patient Instructions (Signed)
You have been scheduled for a colonoscopy. Please follow written instructions given to you at your visit today.  Please pick up your prep supplies at the pharmacy within the next 1-3 days. If you use inhalers (even only as needed), please bring them with you on the day of your procedure.  The [facility] providers would like to encourage you to use MYCHART to communicate with providers for non-urgent requests or questions.  Due to long hold times on the telephone, sending your provider a message by MYCHART may be a faster and more efficient way to get a response.  Please allow 48 business hours for a response.  Please remember that this is for non-urgent requests.   Due to recent changes in healthcare laws, you may see the results of your imaging and laboratory studies on MyChart before your provider has had a chance to review them.  We understand that in some cases there may be results that are confusing or concerning to you. Not all laboratory results come back in the same time frame and the provider may be waiting for multiple results in order to interpret others.  Please give us 48 hours in order for your provider to thoroughly review all the results before contacting the office for clarification of your results.   Thank you for choosing me and [facility].  Malcolm T. Stark, Jr., MD., FACG  

## 2021-09-25 DIAGNOSIS — L821 Other seborrheic keratosis: Secondary | ICD-10-CM | POA: Diagnosis not present

## 2021-09-25 DIAGNOSIS — Z85828 Personal history of other malignant neoplasm of skin: Secondary | ICD-10-CM | POA: Diagnosis not present

## 2021-09-25 DIAGNOSIS — D485 Neoplasm of uncertain behavior of skin: Secondary | ICD-10-CM | POA: Diagnosis not present

## 2021-09-25 DIAGNOSIS — D1801 Hemangioma of skin and subcutaneous tissue: Secondary | ICD-10-CM | POA: Diagnosis not present

## 2021-09-25 DIAGNOSIS — L820 Inflamed seborrheic keratosis: Secondary | ICD-10-CM | POA: Diagnosis not present

## 2021-09-25 DIAGNOSIS — L570 Actinic keratosis: Secondary | ICD-10-CM | POA: Diagnosis not present

## 2021-09-25 DIAGNOSIS — L738 Other specified follicular disorders: Secondary | ICD-10-CM | POA: Diagnosis not present

## 2021-09-25 DIAGNOSIS — D2261 Melanocytic nevi of right upper limb, including shoulder: Secondary | ICD-10-CM | POA: Diagnosis not present

## 2021-10-29 ENCOUNTER — Encounter: Payer: BC Managed Care – PPO | Admitting: Gastroenterology

## 2021-10-31 ENCOUNTER — Ambulatory Visit (AMBULATORY_SURGERY_CENTER): Payer: BC Managed Care – PPO | Admitting: Gastroenterology

## 2021-10-31 ENCOUNTER — Encounter: Payer: Self-pay | Admitting: Gastroenterology

## 2021-10-31 VITALS — BP 131/89 | HR 69 | Temp 97.5°F | Resp 12 | Ht 72.0 in | Wt 202.0 lb

## 2021-10-31 DIAGNOSIS — Z800 Family history of malignant neoplasm of digestive organs: Secondary | ICD-10-CM

## 2021-10-31 DIAGNOSIS — Z86010 Personal history of colonic polyps: Secondary | ICD-10-CM

## 2021-10-31 DIAGNOSIS — Z1211 Encounter for screening for malignant neoplasm of colon: Secondary | ICD-10-CM | POA: Diagnosis not present

## 2021-10-31 MED ORDER — SODIUM CHLORIDE 0.9 % IV SOLN: 500.0000 mL | Freq: Once | INTRAVENOUS | Status: DC

## 2021-10-31 NOTE — Progress Notes (Signed)
Report given to PACU, vss 

## 2021-10-31 NOTE — Progress Notes (Signed)
History & Physical  Primary Care Physician:  Biagio Borg, MD Primary Gastroenterologist: Lucio Edward, MD  CHIEF COMPLAINT:  Personal history of colon polyps, family history of colon cancer  HPI: Fred [name] is a 60 y.o. male with a personal history of adenomatous colon polyps and a strong family history of colon cancer for surveillance colonoscopy.    Past Medical History:  Diagnosis Date   ALCOHOL ABUSE, EPISODIC, HX OF 02/15/2008   ALLERGIC RHINITIS 02/15/2008   DEPRESSION 07/28/2007   HEARING LOSS 08/05/2008   HYPERLIPIDEMIA 02/11/2008   HYPERTENSION 07/28/2007   PSA, INCREASED 10/01/2010   Substance abuse ([place])    TRANSAMINASES, SERUM, ELEVATED 09/01/2009   Tubular adenoma of colon 10/25/2010    Past Surgical History:  Procedure Laterality Date   COLONOSCOPY     FOOT SURGERY Right    from injury   inguinal herniorrhapy Bilateral 1976   KNEE ARTHROSCOPY, MEDIAL PATELLO FEMORAL LIGAMENT RECONSTRUCTION W/ HAMSTRING GRAFT Left 2012   They cleaned cartilege out of my knee   left foot surgery  08/2007   POLYPECTOMY      Prior to Admission medications   Medication Sig Start Date End Date Taking? Authorizing Provider  amLODipine (NORVASC) 5 MG tablet Take 1 tablet (5 mg total) by mouth daily. 01/04/21  Yes Biagio Borg, MD  aspirin 81 MG chewable tablet Chew 1 tablet by mouth daily.   Yes [provider]  atorvastatin (LIPITOR) 40 MG tablet Take 1 tablet (40 mg total) by mouth daily. 01/04/21  Yes Biagio Borg, MD  benazepril (LOTENSIN) 40 MG tablet Take 1 tablet (40 mg total) by mouth daily. 01/04/21  Yes Biagio Borg, MD  buPROPion (WELLBUTRIN XL) 300 MG 24 hr tablet Take 1 tablet (300 mg total) by mouth daily. 01/04/21  Yes Biagio Borg, MD  hydrochlorothiazide (MICROZIDE) 12.5 MG capsule TAKE 1 CAPSULE BY MOUTH EVERY DAY 01/04/21  Yes Biagio Borg, MD  loratadine (CLARITIN) 10 MG tablet Take 10 mg by mouth daily.   Yes [provider]   methocarbamol (ROBAXIN) 500 MG tablet Take 500 mg by mouth at bedtime. 08/13/21  Yes [provider]  mirtazapine (REMERON) 45 MG tablet Take 1 tablet (45 mg total) by mouth at bedtime. 01/04/21  Yes Biagio Borg, MD  naproxen (NAPROSYN) 500 MG tablet Take 500 mg by mouth as needed.   Yes [provider]  traMADol (ULTRAM) 50 MG tablet Take 50 mg by mouth as needed. 08/14/21  Yes [provider]  sildenafil (VIAGRA) 100 MG tablet Take 0.5-1 tablets (50-100 mg total) by mouth daily as needed for erectile dysfunction. Patient not taking: Reported on 10/31/2021 01/04/21   Biagio Borg, MD    Current Outpatient Medications  Medication Sig Dispense Refill   amLODipine (NORVASC) 5 MG tablet Take 1 tablet (5 mg total) by mouth daily. 90 tablet 3   aspirin 81 MG chewable tablet Chew 1 tablet by mouth daily.     atorvastatin (LIPITOR) 40 MG tablet Take 1 tablet (40 mg total) by mouth daily. 90 tablet 3   benazepril (LOTENSIN) 40 MG tablet Take 1 tablet (40 mg total) by mouth daily. 90 tablet 3   buPROPion (WELLBUTRIN XL) 300 MG 24 hr tablet Take 1 tablet (300 mg total) by mouth daily. 90 tablet 3   hydrochlorothiazide (MICROZIDE) 12.5 MG capsule TAKE 1 CAPSULE BY MOUTH EVERY DAY 90 capsule 3   loratadine (CLARITIN) 10 MG tablet Take 10  mg by mouth daily.     methocarbamol (ROBAXIN) 500 MG tablet Take 500 mg by mouth at bedtime.     mirtazapine (REMERON) 45 MG tablet Take 1 tablet (45 mg total) by mouth at bedtime. 90 tablet 3   naproxen (NAPROSYN) 500 MG tablet Take 500 mg by mouth as needed.     traMADol (ULTRAM) 50 MG tablet Take 50 mg by mouth as needed.     sildenafil (VIAGRA) 100 MG tablet Take 0.5-1 tablets (50-100 mg total) by mouth daily as needed for erectile dysfunction. (Patient not taking: Reported on 10/31/2021) 10 tablet 11   Current Facility-Administered Medications  Medication Dose Route Frequency Provider Last Rate Last Admin   0.9 %  sodium chloride infusion   500 mL Intravenous Once Ladene Artist, MD        Allergies as of 10/31/2021   (No Known Allergies)    Family History  Problem Relation Age of Onset   Depression Mother    Alcohol abuse Mother    Hypertension Father    Prostate cancer Father    Colon cancer Father    Kidney disease Father    Colon polyps Sister    Colon cancer Brother 25   Colon cancer Paternal Grandmother    Rectal cancer Neg Hx    Stomach cancer Neg Hx    Esophageal cancer Neg Hx     Social History   Socioeconomic History   Marital status: Single    Spouse name: Not on file   Number of children: 0   Years of education: Not on file   Highest education level: Not on file  Occupational History   Occupation: Editor, commissioning: Building surveyor FOR SELF EMPLOYED  Tobacco Use   Smoking status: Never   Smokeless tobacco: Never  Vaping Use   Vaping Use: Never used  Substance and Sexual Activity   Alcohol use: No   Drug use: No   Sexual activity: Yes    Partners: Female  Other Topics Concern   Not on file  Social History Narrative   Not on file   Social Determinants of Health   Financial Resource Strain: Not on file  Food Insecurity: Not on file  Transportation Needs: Not on file  Physical Activity: Not on file  Stress: Not on file  Social Connections: Not on file  Intimate Partner Violence: Not on file    Review of Systems:  All systems reviewed an negative except where noted in HPI.  Gen: Denies any fever, chills, sweats, anorexia, fatigue, weakness, malaise, weight loss, and sleep disorder CV: Denies chest pain, angina, palpitations, syncope, orthopnea, PND, peripheral edema, and claudication. Resp: Denies dyspnea at rest, dyspnea with exercise, cough, sputum, wheezing, coughing up blood, and pleurisy. GI: Denies vomiting blood, jaundice, and fecal incontinence.   Denies dysphagia or odynophagia. GU : Denies urinary burning, blood in urine, urinary frequency, urinary  hesitancy, nocturnal urination, and urinary incontinence. MS: Denies joint pain, limitation of movement, and swelling, stiffness, low back pain, extremity pain. Denies muscle weakness, cramps, atrophy.  Derm: Denies rash, itching, dry skin, hives, moles, warts, or unhealing ulcers.  Psych: Denies depression, anxiety, memory loss, suicidal ideation, hallucinations, paranoia, and confusion. Heme: Denies bruising, bleeding, and enlarged lymph nodes. Neuro:  Denies any headaches, dizziness, paresthesias. Endo:  Denies any problems with DM, thyroid, adrenal function.   Physical Exam: General:  Alert, well-developed, in NAD Head:  Normocephalic and atraumatic. Eyes:  Sclera clear, no icterus.  Conjunctiva pink. Ears:  Normal auditory acuity. Mouth:  No deformity or lesions.  Neck:  Supple; no masses . Lungs:  Clear throughout to auscultation.   No wheezes, crackles, or rhonchi. No acute distress. Heart:  Regular rate and rhythm; no murmurs. Abdomen:  Soft, nondistended, nontender. No masses, hepatomegaly. No obvious masses.  Normal bowel .    Rectal:  Deferred   Msk:  Symmetrical without gross deformities.. Pulses:  Normal pulses noted. Extremities:  Without edema. Neurologic:  Alert and  oriented x4;  grossly normal neurologically. Skin:  Intact without significant lesions or rashes. Cervical Nodes:  No significant cervical adenopathy. Psych:  Alert and cooperative. Normal mood and affect.   Impression / Plan:   Personal history of adenomatous colon polyps and a strong family history of colon cancer in his father, brother and paternal grandmother for surveillance colonoscopy.    Pricilla Riffle. Fuller Plan  10/31/2021, 9:47 AM See Shea Evans, [facility], to contact our on call provider

## 2021-10-31 NOTE — Patient Instructions (Signed)
YOU HAD AN ENDOSCOPIC PROCEDURE TODAY AT [facility]:   Refer to the procedure report that was given to you for any specific questions about what was found during the examination.  If the procedure report does not answer your questions, please call your gastroenterologist to clarify.  If you requested that your care partner not be given the details of your procedure findings, then the procedure report has been included in a sealed envelope for you to review at your convenience later.  **Handouts given on hemorrhoids**  YOU SHOULD EXPECT: Some feelings of bloating in the abdomen. Passage of more gas than usual.  Walking can help get rid of the air that was put into your GI tract during the procedure and reduce the bloating. If you had a lower endoscopy (such as a colonoscopy or flexible sigmoidoscopy) you may notice spotting of blood in your stool or on the toilet paper. If you underwent a bowel prep for your procedure, you may not have a normal bowel movement for a few days.  Please Note:  You might notice some irritation and congestion in your nose or some drainage.  This is from the oxygen used during your procedure.  There is no need for concern and it should clear up in a day or so.  SYMPTOMS TO REPORT IMMEDIATELY:  Following lower endoscopy (colonoscopy or flexible sigmoidoscopy):  Excessive amounts of blood in the stool  Significant tenderness or worsening of abdominal pains  Swelling of the abdomen that is new, acute  Fever of 100F or higher  For urgent or emergent issues, a gastroenterologist can be reached at any hour by calling [phone]. Do not use MyChart messaging for urgent concerns.    DIET:  We do recommend a small meal at first, but then you may proceed to your regular diet.  Drink plenty of fluids but you should avoid alcoholic beverages for 24 hours.  ACTIVITY:  You should plan to take it easy for the rest of today and you should NOT DRIVE or use heavy  machinery until tomorrow (because of the sedation medicines used during the test).    FOLLOW UP: Our staff will call the number listed on your records 48-72 hours following your procedure to check on you and address any questions or concerns that you may have regarding the information given to you following your procedure. If we do not reach you, we will leave a message.  We will attempt to reach you two times.  During this call, we will ask if you have developed any symptoms of COVID 19. If you develop any symptoms (ie: fever, flu-like symptoms, shortness of breath, cough etc.) before then, please call [phone].  If you test positive for Covid 19 in the 2 weeks post procedure, please call and report this information to [place].    If any biopsies were taken you will be contacted by phone or by letter within the next 1-3 weeks.  Please call us at [phone] if you have not heard about the biopsies in 3 weeks.    SIGNATURES/CONFIDENTIALITY: You and/or your care partner have signed paperwork which will be entered into your electronic medical record.  These signatures attest to the fact that that the information above on your After Visit Summary has been reviewed and is understood.  Full responsibility of the confidentiality of this discharge information lies with you and/or your care-partner.

## 2021-10-31 NOTE — Progress Notes (Signed)
Pt's states no medical or surgical changes since previsit or office visit. VS assessed by C.W 

## 2021-10-31 NOTE — Op Note (Signed)
[facility] Patient Name: Fred [name] Procedure Date: 10/31/2021 9:43 AM MRN: [id number] Endoscopist: Ladene Artist , MD Age: 60 Referring MD:  Date of Birth: 01/04/1961 Gender: Male Account #: [id number] Procedure:                Colonoscopy Indications:              Surveillance: Personal history of adenomatous                            polyps on last colonoscopy > 3 years ago, Strong                            family history of colon cancer, 2 first degree                            relatives and 1 second degree relative. Medicines:                Monitored Anesthesia Care Procedure:                Pre-Anesthesia Assessment:                           - Prior to the procedure, a History and Physical                            was performed, and patient medications and                            allergies were reviewed. The patient's tolerance of                            previous anesthesia was also reviewed. The risks                            and benefits of the procedure and the sedation                            options and risks were discussed with the patient.                            All questions were answered, and informed consent                            was obtained. Prior Anticoagulants: The patient has                            taken no previous anticoagulant or antiplatelet                            agents. ASA Grade Assessment: II - A patient with                            mild systemic disease. After reviewing the risks  and benefits, the patient was deemed in                            satisfactory condition to undergo the procedure.                           After obtaining informed consent, the colonoscope                            was passed under direct vision. Throughout the                            procedure, the patient's blood pressure, pulse, and                            oxygen saturations were  monitored continuously. The                            Olympus CF-HQ190L [id number]) Colonoscope was                            introduced through the anus and advanced to the the                            cecum, identified by appendiceal orifice and                            ileocecal valve. The ileocecal valve, appendiceal                            orifice, and rectum were photographed. The quality                            of the bowel preparation was excellent. The                            colonoscopy was performed without difficulty. The                            patient tolerated the procedure well. Scope In: 9:50:29 AM Scope Out: 10:01:34 AM Scope Withdrawal Time: 0 hours 9 minutes 9 seconds  Total Procedure Duration: 0 hours 11 minutes 5 seconds  Findings:                 The perianal and digital rectal examinations were                            normal.                           Internal hemorrhoids were found during                            retroflexion. The hemorrhoids were small and Grade  I (internal hemorrhoids that do not prolapse).                           The exam was otherwise without abnormality on                            direct and retroflexion views. Complications:            No immediate complications. Estimated blood loss:                            None. Estimated Blood Loss:     Estimated blood loss: none. Impression:               - Internal hemorrhoids.                           - The examination was otherwise normal on direct                            and retroflexion views.                           - No specimens collected. Recommendation:           - Repeat colonoscopy in 5 years for surveillance.                           - Patient has a contact number available for                            emergencies. The signs and symptoms of potential                            delayed complications were discussed with the                             patient. Return to normal activities tomorrow.                            Written discharge instructions were provided to the                            patient.                           - Resume previous diet.                           - Continue present medications. Ladene Artist, MD 10/31/2021 10:04:41 AM This report has been signed electronically.

## 2021-11-02 ENCOUNTER — Telehealth: Payer: Self-pay

## 2021-11-02 NOTE — Telephone Encounter (Signed)
Second attempt follow up call to pt, no answer.  

## 2022-01-07 DIAGNOSIS — M546 Pain in thoracic spine: Secondary | ICD-10-CM | POA: Diagnosis not present

## 2022-01-07 DIAGNOSIS — M545 Low back pain, unspecified: Secondary | ICD-10-CM | POA: Diagnosis not present

## 2022-01-07 DIAGNOSIS — M5134 Other intervertebral disc degeneration, thoracic region: Secondary | ICD-10-CM | POA: Diagnosis not present

## 2022-01-08 ENCOUNTER — Encounter: Payer: BC Managed Care – PPO | Admitting: Internal Medicine

## 2022-01-15 DIAGNOSIS — L738 Other specified follicular disorders: Secondary | ICD-10-CM | POA: Diagnosis not present

## 2022-01-15 DIAGNOSIS — L821 Other seborrheic keratosis: Secondary | ICD-10-CM | POA: Diagnosis not present

## 2022-01-15 DIAGNOSIS — L570 Actinic keratosis: Secondary | ICD-10-CM | POA: Diagnosis not present

## 2022-01-15 DIAGNOSIS — Z85828 Personal history of other malignant neoplasm of skin: Secondary | ICD-10-CM | POA: Diagnosis not present

## 2022-01-22 ENCOUNTER — Other Ambulatory Visit (INDEPENDENT_AMBULATORY_CARE_PROVIDER_SITE_OTHER): Payer: BC Managed Care – PPO

## 2022-01-22 DIAGNOSIS — E559 Vitamin D deficiency, unspecified: Secondary | ICD-10-CM | POA: Diagnosis not present

## 2022-01-22 DIAGNOSIS — R972 Elevated prostate specific antigen [PSA]: Secondary | ICD-10-CM

## 2022-01-22 DIAGNOSIS — E7849 Other hyperlipidemia: Secondary | ICD-10-CM

## 2022-01-22 DIAGNOSIS — R739 Hyperglycemia, unspecified: Secondary | ICD-10-CM | POA: Diagnosis not present

## 2022-01-22 DIAGNOSIS — E538 Deficiency of other specified B group vitamins: Secondary | ICD-10-CM

## 2022-01-22 LAB — CBC WITH DIFFERENTIAL/PLATELET
Basophils Absolute: 0 10*3/uL (ref 0.0–0.1)
Basophils Relative: 0.6 % (ref 0.0–3.0)
Eosinophils Absolute: 0.2 10*3/uL (ref 0.0–0.7)
Eosinophils Relative: 2.5 % (ref 0.0–5.0)
HCT: 43.4 % (ref 39.0–52.0)
Hemoglobin: 14.4 g/dL (ref 13.0–17.0)
Lymphocytes Relative: 31.7 % (ref 12.0–46.0)
Lymphs Abs: 2.4 10*3/uL (ref 0.7–4.0)
MCHC: 33.2 g/dL (ref 30.0–36.0)
MCV: 96.2 fl (ref 78.0–100.0)
Monocytes Absolute: 0.6 10*3/uL (ref 0.1–1.0)
Monocytes Relative: 7.5 % (ref 3.0–12.0)
Neutro Abs: 4.4 10*3/uL (ref 1.4–7.7)
Neutrophils Relative %: 57.7 % (ref 43.0–77.0)
Platelets: 298 10*3/uL (ref 150.0–400.0)
RBC: 4.51 Mil/uL (ref 4.22–5.81)
RDW: 14.4 % (ref 11.5–15.5)
WBC: 7.7 10*3/uL (ref 4.0–10.5)

## 2022-01-22 LAB — LIPID PANEL
Cholesterol: 127 mg/dL (ref 0–200)
HDL: 35.4 mg/dL — ABNORMAL LOW (ref >39.00)
LDL Cholesterol: 59 mg/dL (ref 0–99)
NonHDL: 91.18
Total CHOL/HDL Ratio: 4
Triglycerides: 163 mg/dL — ABNORMAL HIGH (ref 0.0–149.0)
VLDL: 32.6 mg/dL (ref 0.0–40.0)

## 2022-01-22 LAB — HEPATIC FUNCTION PANEL
ALT: 36 U/L (ref 0–53)
AST: 21 U/L (ref 0–37)
Albumin: 4.2 g/dL (ref 3.5–5.2)
Alkaline Phosphatase: 75 U/L (ref 39–117)
Bilirubin, Direct: 0.1 mg/dL (ref 0.0–0.3)
Total Bilirubin: 0.7 mg/dL (ref 0.2–1.2)
Total Protein: 6.3 g/dL (ref 6.0–8.3)

## 2022-01-22 LAB — BASIC METABOLIC PANEL
BUN: 14 mg/dL (ref 6–23)
CO2: 30 mEq/L (ref 19–32)
Calcium: 9.2 mg/dL (ref 8.4–10.5)
Chloride: 105 mEq/L (ref 96–112)
Creatinine, Ser: 1.08 mg/dL (ref 0.40–1.50)
GFR: 74.12 mL/min (ref >60.00)
Glucose, Bld: 102 mg/dL — ABNORMAL HIGH (ref 70–99)
Potassium: 4 mEq/L (ref 3.5–5.1)
Sodium: 140 mEq/L (ref 135–145)

## 2022-01-22 LAB — URINALYSIS, ROUTINE W REFLEX MICROSCOPIC
Bilirubin Urine: NEGATIVE
Hgb urine dipstick: NEGATIVE
Ketones, ur: NEGATIVE
Leukocytes,Ua: NEGATIVE
Nitrite: NEGATIVE
RBC / HPF: NONE SEEN (ref 0–?)
Specific Gravity, Urine: 1.01 (ref 1.000–1.030)
Total Protein, Urine: NEGATIVE
Urine Glucose: NEGATIVE
Urobilinogen, UA: 0.2 (ref 0.0–1.0)
pH: 6.5 (ref 5.0–8.0)

## 2022-01-22 LAB — VITAMIN D 25 HYDROXY (VIT D DEFICIENCY, FRACTURES): VITD: 16.01 ng/mL — ABNORMAL LOW (ref 30.00–100.00)

## 2022-01-22 LAB — PSA: PSA: 9.91 ng/mL — ABNORMAL HIGH (ref 0.10–4.00)

## 2022-01-22 LAB — TSH: TSH: 4 u[IU]/mL (ref 0.35–5.50)

## 2022-01-22 LAB — HEMOGLOBIN A1C: Hgb A1c MFr Bld: 5.5 % (ref 4.6–6.5)

## 2022-01-22 LAB — VITAMIN B12: Vitamin B-12: 288 pg/mL (ref 211–911)

## 2022-01-24 ENCOUNTER — Other Ambulatory Visit: Payer: Self-pay | Admitting: Internal Medicine

## 2022-01-24 NOTE — Telephone Encounter (Signed)
Please to contact pt ? ?Meds refilled x 1 mo ? ?Please for ROV for further refills ?

## 2022-01-25 ENCOUNTER — Encounter: Payer: Self-pay | Admitting: Internal Medicine

## 2022-01-29 ENCOUNTER — Other Ambulatory Visit: Payer: Self-pay

## 2022-01-29 ENCOUNTER — Ambulatory Visit (INDEPENDENT_AMBULATORY_CARE_PROVIDER_SITE_OTHER): Payer: BC Managed Care – PPO | Admitting: Internal Medicine

## 2022-01-29 ENCOUNTER — Encounter: Payer: Self-pay | Admitting: Internal Medicine

## 2022-01-29 VITALS — BP 118/72 | HR 85 | Temp 99.1°F | Ht 72.0 in | Wt 203.0 lb

## 2022-01-29 DIAGNOSIS — R739 Hyperglycemia, unspecified: Secondary | ICD-10-CM

## 2022-01-29 DIAGNOSIS — Z0001 Encounter for general adult medical examination with abnormal findings: Secondary | ICD-10-CM

## 2022-01-29 DIAGNOSIS — I10 Essential (primary) hypertension: Secondary | ICD-10-CM

## 2022-01-29 DIAGNOSIS — R972 Elevated prostate specific antigen [PSA]: Secondary | ICD-10-CM

## 2022-01-29 DIAGNOSIS — E559 Vitamin D deficiency, unspecified: Secondary | ICD-10-CM

## 2022-01-29 DIAGNOSIS — E538 Deficiency of other specified B group vitamins: Secondary | ICD-10-CM

## 2022-01-29 DIAGNOSIS — E7800 Pure hypercholesterolemia, unspecified: Secondary | ICD-10-CM

## 2022-01-29 MED ORDER — ZOSTER VAC RECOMB ADJUVANTED 50 MCG/0.5ML IM SUSR: 0.5000 mL | Freq: Once | INTRAMUSCULAR | 1 refills | Status: AC

## 2022-01-29 NOTE — Patient Instructions (Addendum)
Please have your shingles shot done at the local pharmacy ? ?Please take OTC Vitamin D3 at 2000 units per day, indefinitely ? ?Please continue all other medications as before, and refills have been done if requested. ? ?Please have the pharmacy call with any other refills you may need. ? ?Please continue your efforts at being more active, low cholesterol diet, and weight control. ? ?You are otherwise up to date with prevention measures today. ? ?Please keep your appointments with your specialists as you may have planned ? ?Please make an Appointment to return for your 1 year visit, or sooner if needed, with Lab testing by Appointment as well, to be done about 3-5 days before at the [facility] (so this is for TWO appointments - please see the scheduling desk as you leave) ? ? ?Due to the ongoing Covid 19 pandemic, our lab now requires an appointment for any labs done at our office.  If you need labs done and do not have an appointment, please call our office ahead of time to schedule before presenting to the lab for your testing. ? ?

## 2022-01-29 NOTE — Progress Notes (Signed)
Patient ID: Fred [name], male   DOB: 09-19-1961, 61 y.o.   MRN: [id number] ? ? ? ?     Chief Complaint:: wellness exam and low vit d, elevated PSA, huperglycemia, hld, htn ? ?     HPI:  Fred [name] is a 61 y.o. male here for wellness exam ,   Also brother with colon cancer, pt is s/p colonsocpy 2022. Declines shingrix, and cologuard for now, o/w up to date, except due for shingrix #2 at the local pharmacy ? ?     Also not taking Vit d.  Pt denies chest pain, increased sob or doe, wheezing, orthopnea, PND, increased LE swelling, palpitations, dizziness or syncope.   Pt denies polydipsia, polyuria, or new focal neuro s/s.  Pt denies fever, wt loss, night sweats, loss of appetite, or other constitutional symptoms  No other new complaints.  Denies urinary symptoms such as dysuria, frequency, urgency, flank pain, hematuria or n/v, fever, chills.  ?  ?Wt Readings from Last 3 Encounters:  ?01/29/22 203 lb (92.1 kg)  ?10/31/21 202 lb (91.6 kg)  ?09/17/21 202 lb 8 oz (91.9 kg)  ? ?BP Readings from Last 3 Encounters:  ?01/29/22 118/72  ?10/31/21 131/89  ?09/17/21 136/86  ? ?Immunization History  ?Administered Date(s) Administered  ? Influenza Inj Mdck Quad Pf 09/08/2017  ? Influenza Split 10/08/2011, 08/25/2012  ? Influenza Whole 09/09/2008, 09/01/2009  ? Influenza, Seasonal, Injecte, Preservative Fre 07/26/2013  ? Influenza,inj,Quad PF,6+ Mos 09/01/2018, 09/19/2021  ? Influenza-Unspecified 10/25/2014, 09/25/2015, 08/25/2016, 09/11/2019, 09/14/2020, 10/05/2021  ? PFIZER(Purple Top)SARS-COV-2 Vaccination 02/17/2020, 03/13/2020, 10/31/2020, 09/19/2021  ? Td 09/01/2009  ? Tdap 12/30/2019  ? Unspecified SARS-COV-2 Vaccination 03/01/2021  ? ?There are no preventive care reminders to display for this patient. ? ?  ? ?Past Medical History:  ?Diagnosis Date  ? ALCOHOL ABUSE, EPISODIC, HX OF 02/15/2008  ? ALLERGIC RHINITIS 02/15/2008  ? DEPRESSION 07/28/2007  ? HEARING LOSS 08/05/2008  ? HYPERLIPIDEMIA 02/11/2008  ? HYPERTENSION  07/28/2007  ? PSA, INCREASED 10/01/2010  ? Substance abuse ([place])   ? TRANSAMINASES, SERUM, ELEVATED 09/01/2009  ? Tubular adenoma of colon 10/25/2010  ? ?Past Surgical History:  ?Procedure Laterality Date  ? COLONOSCOPY    ? FOOT SURGERY Right   ? from injury  ? inguinal herniorrhapy Bilateral 1976  ? KNEE ARTHROSCOPY, MEDIAL PATELLO FEMORAL LIGAMENT RECONSTRUCTION W/ HAMSTRING GRAFT Left 2012  ? They cleaned cartilege out of my knee  ? left foot surgery  08/2007  ? POLYPECTOMY    ? ? reports that he has never smoked. He has never used smokeless tobacco. He reports that he does not drink alcohol and does not use drugs. ?family history includes Alcohol abuse in his mother; Colon cancer in his father and paternal grandmother; Colon cancer (age of onset: 82) in his brother; Colon polyps in his sister; Depression in his mother; Hypertension in his father; Kidney disease in his father; Prostate cancer in his father. ?No Known Allergies ?Current Outpatient Medications on File Prior to Visit  ?Medication Sig Dispense Refill  ? amLODipine (NORVASC) 5 MG tablet TAKE 1 TABLET BY MOUTH EVERY DAY 30 tablet 0  ? aspirin 81 MG chewable tablet Chew 1 tablet by mouth daily.    ? atorvastatin (LIPITOR) 40 MG tablet TAKE 1 TABLET BY MOUTH EVERY DAY 30 tablet 0  ? benazepril (LOTENSIN) 40 MG tablet TAKE 1 TABLET BY MOUTH EVERY DAY 30 tablet 0  ? buPROPion (WELLBUTRIN XL) 300 MG 24 hr tablet TAKE 1  TABLET BY MOUTH EVERY DAY 30 tablet 0  ? hydrochlorothiazide (MICROZIDE) 12.5 MG capsule TAKE 1 CAPSULE BY MOUTH EVERY DAY 30 capsule 0  ? loratadine (CLARITIN) 10 MG tablet Take 10 mg by mouth daily.    ? methocarbamol (ROBAXIN) 500 MG tablet Take 500 mg by mouth at bedtime.    ? mirtazapine (REMERON) 45 MG tablet TAKE 1 TABLET BY MOUTH AT BEDTIME 30 tablet 0  ? naproxen (NAPROSYN) 500 MG tablet Take 500 mg by mouth as needed.    ? traMADol (ULTRAM) 50 MG tablet Take 50 mg by mouth as needed.    ? sildenafil (VIAGRA) 100 MG tablet Take  0.5-1 tablets (50-100 mg total) by mouth daily as needed for erectile dysfunction. (Patient not taking: Reported on 10/31/2021) 10 tablet 11  ? ?No current facility-administered medications on file prior to visit.  ? ?     ROS:  All others reviewed and negative. ? ?Objective  ? ?     PE:  BP 118/72 (BP Location: Right Arm, Patient Position: Sitting, Cuff Size: Large)   Pulse 85   Temp 99.1 ?F (37.3 ?C) (Oral)   Ht 6' (1.829 m)   Wt 203 lb (92.1 kg)   SpO2 99%   BMI 27.53 kg/m?  ? ?              Constitutional: Pt appears in NAD ?              HENT: Head: NCAT.  ?              Right Ear: External ear normal.   ?              Left Ear: External ear normal.  ?              Eyes: . Pupils are equal, round, and reactive to light. Conjunctivae and EOM are normal ?              Nose: without d/c or deformity ?              Neck: Neck supple. Gross normal ROM ?              Cardiovascular: Normal rate and regular rhythm.   ?              Pulmonary/Chest: Effort normal and breath sounds without rales or wheezing.  ?              Abd:  Soft, NT, ND, + BS, no organomegaly ?              Neurological: Pt is alert. At baseline orientation, motor grossly intact ?              Skin: Skin is warm. No rashes, no other new lesions, LE edema - none ?              Psychiatric: Pt behavior is normal without agitation  ? ?Micro: none ? ?Cardiac tracings I have personally interpreted today:  none ? ?Pertinent Radiological findings (summarize): none  ? ?Lab Results  ?Component Value Date  ? WBC 7.7 01/22/2022  ? HGB 14.4 01/22/2022  ? HCT 43.4 01/22/2022  ? PLT 298.0 01/22/2022  ? GLUCOSE 102 (H) 01/22/2022  ? CHOL 127 01/22/2022  ? TRIG 163.0 (H) 01/22/2022  ? HDL 35.40 (L) 01/22/2022  ? LDLDIRECT 76.0 12/19/2017  ? [place] 59 01/22/2022  ? ALT 36 01/22/2022  ? AST 21 01/22/2022  ?  NA 140 01/22/2022  ? K 4.0 01/22/2022  ? CL 105 01/22/2022  ? CREATININE 1.08 01/22/2022  ? BUN 14 01/22/2022  ? CO2 30 01/22/2022  ? TSH 4.00 01/22/2022   ? PSA 9.91 (H) 01/22/2022  ? INR 1.0 08/07/2017  ? HGBA1C 5.5 01/22/2022  ? ?Assessment/Plan:  ?YAIDEN YANG is a 61 y.o. White or Caucasian [1] male with  has a past medical history of ALCOHOL ABUSE, EPISODIC, HX OF (02/15/2008), ALLERGIC RHINITIS (02/15/2008), DEPRESSION (07/28/2007), HEARING LOSS (08/05/2008), HYPERLIPIDEMIA (02/11/2008), HYPERTENSION (07/28/2007), PSA, INCREASED (10/01/2010), Substance abuse ([place]), TRANSAMINASES, SERUM, ELEVATED (09/01/2009), and Tubular adenoma of colon (10/25/2010). ? ?Encounter for well adult exam with abnormal findings ?Age and sex appropriate education and counseling updated with regular exercise and diet ?Referrals for preventative services - declines cologuard ?Immunizations addressed - declines shingrix ?Smoking counseling  - none needed ?Evidence for depression or other mood disorder - none significant ?Most recent labs reviewed. ?I have personally reviewed and have noted: ?1) the patient's medical and social history ?2) The patient's current medications and supplements ?3) The patient's height, weight, and BMI have been recorded in the chart ? ? ?PSA, INCREASED ?Lab Results  ?Component Value Date  ? PSA 9.91 (H) 01/22/2022  ? PSA 9.56 (H) 01/01/2021  ? PSA 9.83 (H) 12/23/2019  ? ?Stable, to cont f/u lab ? ?Vitamin D deficiency ?Last vitamin D ?Lab Results  ?Component Value Date  ? VD25OH 16.01 (L) 01/22/2022  ? ?Stable, cont oral replacement ? ? ?Hyperglycemia ?Lab Results  ?Component Value Date  ? HGBA1C 5.5 01/22/2022  ? ?Stable, pt to continue current medical treatment  - diet ? ? ?HLD (hyperlipidemia) ?Lab Results  ?Component Value Date  ? [place] 59 01/22/2022  ? ?Stable, pt to continue current statin lipitor 40 ? ? ?Essential hypertension ?BP Readings from Last 3 Encounters:  ?01/29/22 118/72  ?10/31/21 131/89  ?09/17/21 136/86  ? ?Stable, pt to continue medical treatment norvasc, lotensin, hct ? ?Followup: No follow-ups on file. ? ?Cathlean Cower, MD 02/03/2022  7:33 PM ?[place] ?[place] ?Internal Medicine ?

## 2022-02-03 NOTE — Assessment & Plan Note (Signed)
Lab Results  ?Component Value Date  ? PSA 9.91 (H) 01/22/2022  ? PSA 9.56 (H) 01/01/2021  ? PSA 9.83 (H) 12/23/2019  ? ?Stable, to cont f/u lab ?

## 2022-02-03 NOTE — Assessment & Plan Note (Signed)
Lab Results  ?Component Value Date  ? HGBA1C 5.5 01/22/2022  ? ?Stable, pt to continue current medical treatment  - diet ? ?

## 2022-02-03 NOTE — Assessment & Plan Note (Signed)
Age and sex appropriate education and counseling updated with regular exercise and diet ?Referrals for preventative services - declines cologuard ?Immunizations addressed - declines shingrix ?Smoking counseling  - none needed ?Evidence for depression or other mood disorder - none significant ?Most recent labs reviewed. ?I have personally reviewed and have noted: ?1) the patient's medical and social history ?2) The patient's current medications and supplements ?3) The patient's height, weight, and BMI have been recorded in the chart ? ?

## 2022-02-03 NOTE — Assessment & Plan Note (Signed)
BP Readings from Last 3 Encounters:  ?01/29/22 118/72  ?10/31/21 131/89  ?09/17/21 136/86  ? ?Stable, pt to continue medical treatment norvasc, lotensin, hct ? ?

## 2022-02-03 NOTE — Assessment & Plan Note (Signed)
Last vitamin D ?Lab Results  ?Component Value Date  ? VD25OH 16.01 (L) 01/22/2022  ? ?Stable, cont oral replacement ? ?

## 2022-02-03 NOTE — Assessment & Plan Note (Signed)
Lab Results  ?Component Value Date  ? [place] 59 01/22/2022  ? ?Stable, pt to continue current statin lipitor 40 ? ?

## 2022-02-24 LAB — COLOGUARD

## 2022-03-06 ENCOUNTER — Other Ambulatory Visit: Payer: Self-pay | Admitting: Internal Medicine

## 2022-05-07 DIAGNOSIS — N401 Enlarged prostate with lower urinary tract symptoms: Secondary | ICD-10-CM | POA: Diagnosis not present

## 2022-05-07 DIAGNOSIS — M545 Low back pain, unspecified: Secondary | ICD-10-CM | POA: Diagnosis not present

## 2022-05-07 DIAGNOSIS — R3915 Urgency of urination: Secondary | ICD-10-CM | POA: Diagnosis not present

## 2022-05-07 DIAGNOSIS — M5134 Other intervertebral disc degeneration, thoracic region: Secondary | ICD-10-CM | POA: Diagnosis not present

## 2022-05-07 DIAGNOSIS — M546 Pain in thoracic spine: Secondary | ICD-10-CM | POA: Diagnosis not present

## 2022-05-16 DIAGNOSIS — R3915 Urgency of urination: Secondary | ICD-10-CM | POA: Diagnosis not present

## 2022-05-16 DIAGNOSIS — N401 Enlarged prostate with lower urinary tract symptoms: Secondary | ICD-10-CM | POA: Diagnosis not present

## 2022-05-16 DIAGNOSIS — R351 Nocturia: Secondary | ICD-10-CM | POA: Diagnosis not present

## 2022-05-16 DIAGNOSIS — R972 Elevated prostate specific antigen [PSA]: Secondary | ICD-10-CM | POA: Diagnosis not present

## 2022-09-05 DIAGNOSIS — M545 Low back pain, unspecified: Secondary | ICD-10-CM | POA: Diagnosis not present

## 2022-09-05 DIAGNOSIS — M546 Pain in thoracic spine: Secondary | ICD-10-CM | POA: Diagnosis not present

## 2022-09-13 ENCOUNTER — Other Ambulatory Visit: Payer: Self-pay | Admitting: Internal Medicine

## 2022-11-19 ENCOUNTER — Other Ambulatory Visit: Payer: Self-pay | Admitting: Internal Medicine

## 2022-12-23 DIAGNOSIS — D225 Melanocytic nevi of trunk: Secondary | ICD-10-CM | POA: Diagnosis not present

## 2022-12-23 DIAGNOSIS — L820 Inflamed seborrheic keratosis: Secondary | ICD-10-CM | POA: Diagnosis not present

## 2022-12-23 DIAGNOSIS — D2262 Melanocytic nevi of left upper limb, including shoulder: Secondary | ICD-10-CM | POA: Diagnosis not present

## 2022-12-23 DIAGNOSIS — L570 Actinic keratosis: Secondary | ICD-10-CM | POA: Diagnosis not present

## 2022-12-23 DIAGNOSIS — Z85828 Personal history of other malignant neoplasm of skin: Secondary | ICD-10-CM | POA: Diagnosis not present

## 2022-12-23 DIAGNOSIS — L821 Other seborrheic keratosis: Secondary | ICD-10-CM | POA: Diagnosis not present

## 2023-01-06 DIAGNOSIS — M5414 Radiculopathy, thoracic region: Secondary | ICD-10-CM | POA: Diagnosis not present

## 2023-01-06 DIAGNOSIS — M5134 Other intervertebral disc degeneration, thoracic region: Secondary | ICD-10-CM | POA: Diagnosis not present

## 2023-02-10 DIAGNOSIS — Z5181 Encounter for therapeutic drug level monitoring: Secondary | ICD-10-CM | POA: Diagnosis not present

## 2023-02-10 DIAGNOSIS — Z79899 Other long term (current) drug therapy: Secondary | ICD-10-CM | POA: Diagnosis not present

## 2023-03-03 ENCOUNTER — Encounter: Payer: BC Managed Care – PPO | Admitting: Internal Medicine

## 2023-03-12 ENCOUNTER — Other Ambulatory Visit (INDEPENDENT_AMBULATORY_CARE_PROVIDER_SITE_OTHER): Payer: BC Managed Care – PPO

## 2023-03-12 DIAGNOSIS — E7800 Pure hypercholesterolemia, unspecified: Secondary | ICD-10-CM | POA: Diagnosis not present

## 2023-03-12 DIAGNOSIS — E559 Vitamin D deficiency, unspecified: Secondary | ICD-10-CM | POA: Diagnosis not present

## 2023-03-12 DIAGNOSIS — E538 Deficiency of other specified B group vitamins: Secondary | ICD-10-CM

## 2023-03-12 DIAGNOSIS — R739 Hyperglycemia, unspecified: Secondary | ICD-10-CM | POA: Diagnosis not present

## 2023-03-12 DIAGNOSIS — R972 Elevated prostate specific antigen [PSA]: Secondary | ICD-10-CM | POA: Diagnosis not present

## 2023-03-12 LAB — URINALYSIS, ROUTINE W REFLEX MICROSCOPIC
Bilirubin Urine: NEGATIVE
Hgb urine dipstick: NEGATIVE
Ketones, ur: NEGATIVE
Nitrite: NEGATIVE
Specific Gravity, Urine: 1.025 (ref 1.000–1.030)
Total Protein, Urine: NEGATIVE
Urine Glucose: NEGATIVE
Urobilinogen, UA: 0.2 (ref 0.0–1.0)
pH: 6 (ref 5.0–8.0)

## 2023-03-12 LAB — LIPID PANEL
Cholesterol: 136 mg/dL (ref 0–200)
HDL: 48.1 mg/dL (ref >39.00)
LDL Cholesterol: 69 mg/dL (ref 0–99)
NonHDL: 87.44
Total CHOL/HDL Ratio: 3
Triglycerides: 92 mg/dL (ref 0.0–149.0)
VLDL: 18.4 mg/dL (ref 0.0–40.0)

## 2023-03-12 LAB — CBC WITH DIFFERENTIAL/PLATELET
Basophils Absolute: 0 10*3/uL (ref 0.0–0.1)
Basophils Relative: 0.6 % (ref 0.0–3.0)
Eosinophils Absolute: 0.2 10*3/uL (ref 0.0–0.7)
Eosinophils Relative: 2.1 % (ref 0.0–5.0)
HCT: 42.3 % (ref 39.0–52.0)
Hemoglobin: 14.7 g/dL (ref 13.0–17.0)
Lymphocytes Relative: 28.7 % (ref 12.0–46.0)
Lymphs Abs: 2.1 10*3/uL (ref 0.7–4.0)
MCHC: 34.6 g/dL (ref 30.0–36.0)
MCV: 93.1 fl (ref 78.0–100.0)
Monocytes Absolute: 0.6 10*3/uL (ref 0.1–1.0)
Monocytes Relative: 8.2 % (ref 3.0–12.0)
Neutro Abs: 4.5 10*3/uL (ref 1.4–7.7)
Neutrophils Relative %: 60.4 % (ref 43.0–77.0)
Platelets: 290 10*3/uL (ref 150.0–400.0)
RBC: 4.54 Mil/uL (ref 4.22–5.81)
RDW: 13.9 % (ref 11.5–15.5)
WBC: 7.4 10*3/uL (ref 4.0–10.5)

## 2023-03-12 LAB — HEPATIC FUNCTION PANEL
ALT: 29 U/L (ref 0–53)
AST: 18 U/L (ref 0–37)
Albumin: 4.1 g/dL (ref 3.5–5.2)
Alkaline Phosphatase: 78 U/L (ref 39–117)
Bilirubin, Direct: 0.1 mg/dL (ref 0.0–0.3)
Total Bilirubin: 0.5 mg/dL (ref 0.2–1.2)
Total Protein: 6.3 g/dL (ref 6.0–8.3)

## 2023-03-12 LAB — BASIC METABOLIC PANEL
BUN: 13 mg/dL (ref 6–23)
CO2: 28 mEq/L (ref 19–32)
Calcium: 9.3 mg/dL (ref 8.4–10.5)
Chloride: 104 mEq/L (ref 96–112)
Creatinine, Ser: 1.03 mg/dL (ref 0.40–1.50)
GFR: 77.83 mL/min (ref >60.00)
Glucose, Bld: 114 mg/dL — ABNORMAL HIGH (ref 70–99)
Potassium: 3.7 mEq/L (ref 3.5–5.1)
Sodium: 140 mEq/L (ref 135–145)

## 2023-03-12 LAB — TSH: TSH: 3.11 u[IU]/mL (ref 0.35–5.50)

## 2023-03-12 LAB — PSA: PSA: 13.56 ng/mL — ABNORMAL HIGH (ref 0.10–4.00)

## 2023-03-12 LAB — HEMOGLOBIN A1C: Hgb A1c MFr Bld: 5.6 % (ref 4.6–6.5)

## 2023-03-12 LAB — VITAMIN B12: Vitamin B-12: 579 pg/mL (ref 211–911)

## 2023-03-12 LAB — VITAMIN D 25 HYDROXY (VIT D DEFICIENCY, FRACTURES): VITD: 28.27 ng/mL — ABNORMAL LOW (ref 30.00–100.00)

## 2023-03-17 ENCOUNTER — Ambulatory Visit (INDEPENDENT_AMBULATORY_CARE_PROVIDER_SITE_OTHER): Payer: BC Managed Care – PPO | Admitting: Internal Medicine

## 2023-03-17 ENCOUNTER — Encounter: Payer: Self-pay | Admitting: Internal Medicine

## 2023-03-17 VITALS — BP 124/72 | HR 99 | Temp 98.3°F | Ht 72.0 in | Wt 208.0 lb

## 2023-03-17 DIAGNOSIS — E559 Vitamin D deficiency, unspecified: Secondary | ICD-10-CM | POA: Diagnosis not present

## 2023-03-17 DIAGNOSIS — Z0001 Encounter for general adult medical examination with abnormal findings: Secondary | ICD-10-CM | POA: Diagnosis not present

## 2023-03-17 DIAGNOSIS — R972 Elevated prostate specific antigen [PSA]: Secondary | ICD-10-CM

## 2023-03-17 DIAGNOSIS — R739 Hyperglycemia, unspecified: Secondary | ICD-10-CM | POA: Diagnosis not present

## 2023-03-17 DIAGNOSIS — E538 Deficiency of other specified B group vitamins: Secondary | ICD-10-CM | POA: Diagnosis not present

## 2023-03-17 DIAGNOSIS — H6123 Impacted cerumen, bilateral: Secondary | ICD-10-CM | POA: Diagnosis not present

## 2023-03-17 DIAGNOSIS — I10 Essential (primary) hypertension: Secondary | ICD-10-CM

## 2023-03-17 DIAGNOSIS — E7800 Pure hypercholesterolemia, unspecified: Secondary | ICD-10-CM

## 2023-03-17 MED ORDER — ATORVASTATIN CALCIUM 40 MG PO TABS: 40.0000 mg | ORAL_TABLET | Freq: Every day | ORAL | 3 refills | Status: DC

## 2023-03-17 MED ORDER — BENAZEPRIL HCL 40 MG PO TABS: 40.0000 mg | ORAL_TABLET | Freq: Every day | ORAL | 3 refills | Status: DC

## 2023-03-17 MED ORDER — AMLODIPINE BESYLATE 5 MG PO TABS: 5.0000 mg | ORAL_TABLET | Freq: Every day | ORAL | 3 refills | Status: DC

## 2023-03-17 MED ORDER — HYDROCHLOROTHIAZIDE 12.5 MG PO CAPS: 12.5000 mg | ORAL_CAPSULE | Freq: Every day | ORAL | 3 refills | Status: DC

## 2023-03-17 MED ORDER — MIRTAZAPINE 45 MG PO TABS: 45.0000 mg | ORAL_TABLET | Freq: Every day | ORAL | 3 refills | Status: DC

## 2023-03-17 MED ORDER — SILDENAFIL CITRATE 100 MG PO TABS: ORAL_TABLET | ORAL | 11 refills | Status: DC

## 2023-03-17 MED ORDER — DIAZEPAM 5 MG PO TABS: ORAL_TABLET | ORAL | 0 refills | Status: DC

## 2023-03-17 MED ORDER — BUPROPION HCL ER (XL) 300 MG PO TB24: 300.0000 mg | ORAL_TABLET | Freq: Every day | ORAL | 3 refills | Status: DC

## 2023-03-17 NOTE — Progress Notes (Unsigned)
Patient ID: Fred [name], male   DOB: 1961/10/27, 62 y.o.   MRN: [id number]         Chief Complaint:: wellness exam and elev psa, htn, hld, lo vit d, cerumen impactions       HPI:  Fred [name] is a 62 y.o. male here for wellness exam; does not need cologuard since had colonoscopy last year, declines covid booster, o/w up to date                        Also for MRI prostate soon with increased psa recently per urology.  Pt denies chest pain, increased sob or doe, wheezing, orthopnea, PND, increased LE swelling, palpitations, dizziness or syncope.   Pt denies polydipsia, polyuria, or new focal neuro s/s.    Pt denies fever, wt loss, night sweats, loss of appetite, or other constitutional symptoms    Has reduced hearing to both ears in the past wk with cerumen impactions unable to resolve at home  Wt Readings from Last 3 Encounters:  03/17/23 208 lb (94.3 kg)  01/29/22 203 lb (92.1 kg)  10/31/21 202 lb (91.6 kg)   BP Readings from Last 3 Encounters:  03/17/23 124/72  01/29/22 118/72  10/31/21 131/89   Immunization History  Administered Date(s) Administered   Influenza Inj Mdck Quad Pf 09/08/2017   Influenza Split 10/08/2011, 08/25/2012   Influenza Whole 09/09/2008, 09/01/2009   Influenza, Quadrivalent, Recombinant, Inj, Pf 09/11/2019   Influenza, Seasonal, Injecte, Preservative Fre 07/26/2013   Influenza,inj,Quad PF,6+ Mos 09/01/2018, 09/19/2021   Influenza-Unspecified 10/25/2014, 09/25/2015, 08/25/2016, 09/11/2019, 09/14/2020, 10/05/2021, 09/12/2022   PFIZER(Purple Top)SARS-COV-2 Vaccination 02/17/2020, 03/13/2020, 10/31/2020, 09/19/2021   Pfizer Covid-19 Vaccine Bivalent Booster 63yrs & up 09/12/2022   Td 09/01/2009   Tdap 12/30/2019   Unspecified SARS-COV-2 Vaccination 03/01/2021, 09/07/2021   Zoster Recombinat (Shingrix) 02/21/2022, 06/05/2022  There are no preventive care reminders to display for this patient.    Past Medical History:  Diagnosis Date   ALCOHOL ABUSE,  EPISODIC, HX OF 02/15/2008   ALLERGIC RHINITIS 02/15/2008   DEPRESSION 07/28/2007   HEARING LOSS 08/05/2008   HYPERLIPIDEMIA 02/11/2008   HYPERTENSION 07/28/2007   PSA, INCREASED 10/01/2010   Substance abuse    TRANSAMINASES, SERUM, ELEVATED 09/01/2009   Tubular adenoma of colon 10/25/2010   Past Surgical History:  Procedure Laterality Date   COLONOSCOPY     FOOT SURGERY Right    from injury   inguinal herniorrhapy Bilateral 1976   KNEE ARTHROSCOPY, MEDIAL PATELLO FEMORAL LIGAMENT RECONSTRUCTION W/ HAMSTRING GRAFT Left 2012   They cleaned cartilege out of my knee   left foot surgery  08/2007   POLYPECTOMY      reports that he has never smoked. He has never used smokeless tobacco. He reports that he does not drink alcohol and does not use drugs. family history includes Alcohol abuse in his mother; Colon cancer in his father and paternal grandmother; Colon cancer (age of onset: 48) in his brother; Colon polyps in his sister; Depression in his mother; Hypertension in his father; Kidney disease in his father; Prostate cancer in his father. Allergies  Allergen Reactions   Other Other (See Comments)   Current Outpatient Medications on File Prior to Visit  Medication Sig Dispense Refill   aspirin 81 MG chewable tablet Chew 1 tablet by mouth daily.     loratadine (CLARITIN) 10 MG tablet Take 10 mg by mouth daily.     methocarbamol (ROBAXIN) 500 MG tablet Take  500 mg by mouth at bedtime.     naproxen (NAPROSYN) 500 MG tablet Take 500 mg by mouth as needed.     traMADol (ULTRAM) 50 MG tablet Take 50 mg by mouth as needed.     No current facility-administered medications on file prior to visit.        ROS:  All others reviewed and negative.  Objective        PE:  BP 124/72 (BP Location: Right Arm, Patient Position: Sitting, Cuff Size: Normal)   Pulse 99   Temp 98.3 F (36.8 C) (Oral)   Ht 6' (1.829 m)   Wt 208 lb (94.3 kg)   SpO2 99%   BMI 28.21 kg/m                  Constitutional: Pt appears in NAD               HENT: Head: NCAT.                Right Ear: External ear normal.                 Left Ear: External ear normal. Bilateral cerumen impactions resolved with irrigation               Eyes: . Pupils are equal, round, and reactive to light. Conjunctivae and EOM are normal               Nose: without d/c or deformity               Neck: Neck supple. Gross normal ROM               Cardiovascular: Normal rate and regular rhythm.                 Pulmonary/Chest: Effort normal and breath sounds without rales or wheezing.                Abd:  Soft, NT, ND, + BS, no organomegaly               Neurological: Pt is alert. At baseline orientation, motor grossly intact               Skin: Skin is warm. No rashes, no other new lesions, LE edema - none               Psychiatric: Pt behavior is normal without agitation   Micro: none  Cardiac tracings I have personally interpreted today:  none  Pertinent Radiological findings (summarize): none   Lab Results  Component Value Date   WBC 7.4 03/12/2023   HGB 14.7 03/12/2023   HCT 42.3 03/12/2023   PLT 290.0 03/12/2023   GLUCOSE 114 (H) 03/12/2023   CHOL 136 03/12/2023   TRIG 92.0 03/12/2023   HDL 48.10 03/12/2023   LDLDIRECT 76.0 12/19/2017   LDLCALC 69 03/12/2023   ALT 29 03/12/2023   AST 18 03/12/2023   NA 140 03/12/2023   K 3.7 03/12/2023   CL 104 03/12/2023   CREATININE 1.03 03/12/2023   BUN 13 03/12/2023   CO2 28 03/12/2023   TSH 3.11 03/12/2023   PSA 13.56 (H) 03/12/2023   INR 1.0 08/07/2017   HGBA1C 5.6 03/12/2023   Assessment/Plan:  Fred [name] is a 62 y.o. White or Caucasian [1] male with  has a past medical history of ALCOHOL ABUSE, EPISODIC, HX OF (02/15/2008), ALLERGIC RHINITIS (02/15/2008), DEPRESSION (07/28/2007), HEARING LOSS (08/05/2008), HYPERLIPIDEMIA (02/11/2008), HYPERTENSION (  07/28/2007), PSA, INCREASED (10/01/2010), Substance abuse, TRANSAMINASES, SERUM, ELEVATED  (09/01/2009), and Tubular adenoma of colon (10/25/2010).  Encounter for well adult exam with abnormal findings Age and sex appropriate education and counseling updated with regular exercise and diet Referrals for preventative services - none needed Immunizations addressed - declines covid booster,  Smoking counseling  - none needed Evidence for depression or other mood disorder - stable, no need for change in tx today Most recent labs reviewed. I have personally reviewed and have noted: 1) the patient's medical and social history 2) The patient's current medications and supplements 3) The patient's height, weight, and BMI have been recorded in the chart   PSA, INCREASED Lab Results  Component Value Date   PSA 13.56 (H) 03/12/2023   PSA 9.91 (H) 01/22/2022   PSA 9.56 (H) 01/01/2021   Etilogy unclear, pt with hx of neg biopsy, now for MRI prostate per urology; I gave valium 5 mg x 1 to take 1 hour prior to procedure  Essential hypertension BP Readings from Last 3 Encounters:  03/17/23 124/72  01/29/22 118/72  10/31/21 131/89   Stable, pt to continue medical treatment norvasc 5 mg qd, lotensin 40 mg qd, hct 12.5 mg qd   HLD (hyperlipidemia) Lab Results  Component Value Date   LDLCALC 69 03/12/2023   Stable, pt to continue current statin lipitor 40 mg qd   Vitamin D deficiency Last vitamin D Lab Results  Component Value Date   VD25OH 28.27 (L) 03/12/2023   Low, to start oral replacement   Cerumen impaction Bilateral impactions resolved with irrigation  Ceruminosis is noted.  Wax is removed by syringing and manual debridement. Instructions for home care to prevent wax buildup are given.  Followup: Return in about 1 year (around 03/16/2024).  Oliver Barre, MD 03/18/2023 4:41 AM [facility] Internal Medicine

## 2023-03-17 NOTE — Patient Instructions (Addendum)
Your ears were cleared of wax today  Please take all new medication as prescribed - the valium 5 mg at one hour prior to the MRI  Please take OTC Vitamin D3 at 2000 units per day, indefinitely  Please continue all other medications as before, and refills have been done if requested.  Please have the pharmacy call with any other refills you may need.  Please continue your efforts at being more active, low cholesterol diet, and weight control.  You are otherwise up to date with prevention measures today.  Please keep your appointments with your specialists as you may have planned  Please make an Appointment to return for your 1 year visit, or sooner if needed, with Lab testing by Appointment as well, to be done about 3-5 days before at the FIRST FLOOR Lab (so this is for TWO appointments - please see the scheduling desk as you leave)

## 2023-03-18 NOTE — Assessment & Plan Note (Signed)
Lab Results  Component Value Date   PSA 13.56 (H) 03/12/2023   PSA 9.91 (H) 01/22/2022   PSA 9.56 (H) 01/01/2021   Etilogy unclear, pt with hx of neg biopsy, now for MRI prostate per urology; I gave valium 5 mg x 1 to take 1 hour prior to procedure

## 2023-03-18 NOTE — Assessment & Plan Note (Signed)
Age and sex appropriate education and counseling updated with regular exercise and diet Referrals for preventative services - none needed Immunizations addressed - declines covid booster,  Smoking counseling  - none needed Evidence for depression or other mood disorder - stable, no need for change in tx today Most recent labs reviewed. I have personally reviewed and have noted: 1) the patient's medical and social history 2) The patient's current medications and supplements 3) The patient's height, weight, and BMI have been recorded in the chart

## 2023-03-18 NOTE — Assessment & Plan Note (Signed)
Lab Results  Component Value Date   LDLCALC 69 03/12/2023   Stable, pt to continue current statin lipitor 40 mg qd

## 2023-03-18 NOTE — Assessment & Plan Note (Signed)
Bilateral impactions resolved with irrigation  Ceruminosis is noted.  Wax is removed by syringing and manual debridement. Instructions for home care to prevent wax buildup are given.

## 2023-03-18 NOTE — Assessment & Plan Note (Signed)
Last vitamin D Lab Results  Component Value Date   VD25OH 28.27 (L) 03/12/2023   Low, to start oral replacement

## 2023-03-18 NOTE — Assessment & Plan Note (Signed)
BP Readings from Last 3 Encounters:  03/17/23 124/72  01/29/22 118/72  10/31/21 131/89   Stable, pt to continue medical treatment norvasc 5 mg qd, lotensin 40 mg qd, hct 12.5 mg qd

## 2023-03-19 ENCOUNTER — Other Ambulatory Visit: Payer: Self-pay | Admitting: Urology

## 2023-03-19 DIAGNOSIS — R972 Elevated prostate specific antigen [PSA]: Secondary | ICD-10-CM

## 2023-05-05 ENCOUNTER — Ambulatory Visit
Admission: RE | Admit: 2023-05-05 | Discharge: 2023-05-05 | Disposition: A | Discharge status: Home / Self Care | Payer: BC Managed Care – PPO | Source: Ambulatory Visit | Attending: Urology | Admitting: Urology

## 2023-05-05 DIAGNOSIS — R972 Elevated prostate specific antigen [PSA]: Secondary | ICD-10-CM | POA: Diagnosis not present

## 2023-05-05 MED ORDER — GADOPICLENOL 0.5 MMOL/ML IV SOLN
10.0000 mL | Freq: Once | INTRAVENOUS | Status: AC | PRN
  Administered 2023-05-05: 10 mL via INTRAVENOUS

## 2023-06-04 DIAGNOSIS — M5134 Other intervertebral disc degeneration, thoracic region: Secondary | ICD-10-CM | POA: Diagnosis not present

## 2023-08-07 ENCOUNTER — Other Ambulatory Visit: Payer: Self-pay | Admitting: Internal Medicine

## 2023-09-03 DIAGNOSIS — L568 Other specified acute skin changes due to ultraviolet radiation: Secondary | ICD-10-CM | POA: Diagnosis not present

## 2023-09-03 DIAGNOSIS — L821 Other seborrheic keratosis: Secondary | ICD-10-CM | POA: Diagnosis not present

## 2023-09-03 DIAGNOSIS — Z85828 Personal history of other malignant neoplasm of skin: Secondary | ICD-10-CM | POA: Diagnosis not present

## 2023-09-03 DIAGNOSIS — L570 Actinic keratosis: Secondary | ICD-10-CM | POA: Diagnosis not present

## 2023-12-02 DIAGNOSIS — M5134 Other intervertebral disc degeneration, thoracic region: Secondary | ICD-10-CM | POA: Diagnosis not present

## 2023-12-02 DIAGNOSIS — M545 Low back pain, unspecified: Secondary | ICD-10-CM | POA: Diagnosis not present

## 2024-03-17 ENCOUNTER — Other Ambulatory Visit: Payer: Self-pay

## 2024-03-17 ENCOUNTER — Other Ambulatory Visit: Payer: Self-pay | Admitting: Internal Medicine

## 2024-03-17 ENCOUNTER — Encounter: Payer: BC Managed Care – PPO | Admitting: Internal Medicine

## 2024-03-17 DIAGNOSIS — D225 Melanocytic nevi of trunk: Secondary | ICD-10-CM | POA: Diagnosis not present

## 2024-03-17 DIAGNOSIS — L738 Other specified follicular disorders: Secondary | ICD-10-CM | POA: Diagnosis not present

## 2024-03-17 DIAGNOSIS — L570 Actinic keratosis: Secondary | ICD-10-CM | POA: Diagnosis not present

## 2024-03-17 DIAGNOSIS — C44319 Basal cell carcinoma of skin of other parts of face: Secondary | ICD-10-CM | POA: Diagnosis not present

## 2024-03-17 DIAGNOSIS — D2272 Melanocytic nevi of left lower limb, including hip: Secondary | ICD-10-CM | POA: Diagnosis not present

## 2024-03-17 DIAGNOSIS — Z85828 Personal history of other malignant neoplasm of skin: Secondary | ICD-10-CM | POA: Diagnosis not present

## 2024-03-25 ENCOUNTER — Other Ambulatory Visit

## 2024-03-25 DIAGNOSIS — R739 Hyperglycemia, unspecified: Secondary | ICD-10-CM

## 2024-03-25 DIAGNOSIS — E559 Vitamin D deficiency, unspecified: Secondary | ICD-10-CM

## 2024-03-25 DIAGNOSIS — E7800 Pure hypercholesterolemia, unspecified: Secondary | ICD-10-CM

## 2024-03-25 DIAGNOSIS — E538 Deficiency of other specified B group vitamins: Secondary | ICD-10-CM

## 2024-03-25 DIAGNOSIS — R972 Elevated prostate specific antigen [PSA]: Secondary | ICD-10-CM

## 2024-03-25 LAB — CBC WITH DIFFERENTIAL/PLATELET
Basophils Absolute: 0 10*3/uL (ref 0.0–0.1)
Basophils Relative: 0.6 % (ref 0.0–3.0)
Eosinophils Absolute: 0.1 10*3/uL (ref 0.0–0.7)
Eosinophils Relative: 1.2 % (ref 0.0–5.0)
HCT: 44.5 % (ref 39.0–52.0)
Hemoglobin: 14.8 g/dL (ref 13.0–17.0)
Lymphocytes Relative: 25.5 % (ref 12.0–46.0)
Lymphs Abs: 1.7 10*3/uL (ref 0.7–4.0)
MCHC: 33.3 g/dL (ref 30.0–36.0)
MCV: 95.5 fl (ref 78.0–100.0)
Monocytes Absolute: 0.6 10*3/uL (ref 0.1–1.0)
Monocytes Relative: 9.5 % (ref 3.0–12.0)
Neutro Abs: 4.2 10*3/uL (ref 1.4–7.7)
Neutrophils Relative %: 63.2 % (ref 43.0–77.0)
Platelets: 265 10*3/uL (ref 150.0–400.0)
RBC: 4.66 Mil/uL (ref 4.22–5.81)
RDW: 15.3 % (ref 11.5–15.5)
WBC: 6.7 10*3/uL (ref 4.0–10.5)

## 2024-03-25 LAB — HEPATIC FUNCTION PANEL
ALT: 76 U/L — ABNORMAL HIGH (ref 0–53)
AST: 41 U/L — ABNORMAL HIGH (ref 0–37)
Albumin: 4.4 g/dL (ref 3.5–5.2)
Alkaline Phosphatase: 79 U/L (ref 39–117)
Bilirubin, Direct: 0.2 mg/dL (ref 0.0–0.3)
Total Bilirubin: 0.8 mg/dL (ref 0.2–1.2)
Total Protein: 6.7 g/dL (ref 6.0–8.3)

## 2024-03-25 LAB — URINALYSIS, ROUTINE W REFLEX MICROSCOPIC
Bilirubin Urine: NEGATIVE
Hgb urine dipstick: NEGATIVE
Ketones, ur: NEGATIVE
Nitrite: NEGATIVE
RBC / HPF: NONE SEEN (ref 0–?)
Specific Gravity, Urine: >=1.03 — AB (ref 1.000–1.030)
Total Protein, Urine: NEGATIVE
Urine Glucose: NEGATIVE
Urobilinogen, UA: 0.2 (ref 0.0–1.0)
pH: 6 (ref 5.0–8.0)

## 2024-03-25 LAB — BASIC METABOLIC PANEL WITH GFR
BUN: 13 mg/dL (ref 6–23)
CO2: 27 meq/L (ref 19–32)
Calcium: 9.4 mg/dL (ref 8.4–10.5)
Chloride: 105 meq/L (ref 96–112)
Creatinine, Ser: 0.91 mg/dL (ref 0.40–1.50)
GFR: 89.65 mL/min (ref >60.00)
Glucose, Bld: 93 mg/dL (ref 70–99)
Potassium: 4.1 meq/L (ref 3.5–5.1)
Sodium: 141 meq/L (ref 135–145)

## 2024-03-25 LAB — LIPID PANEL
Cholesterol: 150 mg/dL (ref 0–200)
HDL: 50 mg/dL (ref >39.00)
LDL Cholesterol: 84 mg/dL (ref 0–99)
NonHDL: 99.69
Total CHOL/HDL Ratio: 3
Triglycerides: 76 mg/dL (ref 0.0–149.0)
VLDL: 15.2 mg/dL (ref 0.0–40.0)

## 2024-03-25 LAB — HEMOGLOBIN A1C: Hgb A1c MFr Bld: 5.5 % (ref 4.6–6.5)

## 2024-03-25 LAB — PSA: PSA: 10.81 ng/mL — ABNORMAL HIGH (ref 0.10–4.00)

## 2024-03-25 LAB — VITAMIN B12: Vitamin B-12: 238 pg/mL (ref 211–911)

## 2024-03-25 LAB — TSH: TSH: 2.36 u[IU]/mL (ref 0.35–5.50)

## 2024-03-25 LAB — VITAMIN D 25 HYDROXY (VIT D DEFICIENCY, FRACTURES): VITD: 39.66 ng/mL (ref 30.00–100.00)

## 2024-03-30 ENCOUNTER — Encounter: Admitting: Internal Medicine

## 2024-04-21 DIAGNOSIS — C44319 Basal cell carcinoma of skin of other parts of face: Secondary | ICD-10-CM | POA: Diagnosis not present

## 2024-05-11 ENCOUNTER — Ambulatory Visit: Payer: Self-pay | Admitting: Internal Medicine

## 2024-05-11 VITALS — BP 136/84 | HR 78 | Temp 98.9°F | Ht 72.0 in | Wt 209.0 lb

## 2024-05-11 DIAGNOSIS — R3911 Hesitancy of micturition: Secondary | ICD-10-CM

## 2024-05-11 DIAGNOSIS — R739 Hyperglycemia, unspecified: Secondary | ICD-10-CM

## 2024-05-11 DIAGNOSIS — F1021 Alcohol dependence, in remission: Secondary | ICD-10-CM

## 2024-05-11 DIAGNOSIS — R972 Elevated prostate specific antigen [PSA]: Secondary | ICD-10-CM | POA: Diagnosis not present

## 2024-05-11 DIAGNOSIS — I10 Essential (primary) hypertension: Secondary | ICD-10-CM

## 2024-05-11 DIAGNOSIS — Z0000 Encounter for general adult medical examination without abnormal findings: Secondary | ICD-10-CM

## 2024-05-11 DIAGNOSIS — E7800 Pure hypercholesterolemia, unspecified: Secondary | ICD-10-CM | POA: Diagnosis not present

## 2024-05-11 DIAGNOSIS — N401 Enlarged prostate with lower urinary tract symptoms: Secondary | ICD-10-CM | POA: Diagnosis not present

## 2024-05-11 DIAGNOSIS — Z0001 Encounter for general adult medical examination with abnormal findings: Secondary | ICD-10-CM

## 2024-05-11 DIAGNOSIS — E538 Deficiency of other specified B group vitamins: Secondary | ICD-10-CM

## 2024-05-11 DIAGNOSIS — N400 Enlarged prostate without lower urinary tract symptoms: Secondary | ICD-10-CM | POA: Insufficient documentation

## 2024-05-11 DIAGNOSIS — E559 Vitamin D deficiency, unspecified: Secondary | ICD-10-CM

## 2024-05-11 MED ORDER — SILDENAFIL CITRATE 100 MG PO TABS: ORAL_TABLET | ORAL | 11 refills | Status: AC

## 2024-05-11 MED ORDER — ATORVASTATIN CALCIUM 40 MG PO TABS: 40.0000 mg | ORAL_TABLET | Freq: Every day | ORAL | 3 refills | Status: AC

## 2024-05-11 MED ORDER — MIRTAZAPINE 45 MG PO TABS: 45.0000 mg | ORAL_TABLET | Freq: Every day | ORAL | 3 refills | Status: AC

## 2024-05-11 MED ORDER — AMLODIPINE BESYLATE 5 MG PO TABS: 5.0000 mg | ORAL_TABLET | Freq: Every day | ORAL | 3 refills | Status: AC

## 2024-05-11 MED ORDER — TAMSULOSIN HCL 0.4 MG PO CAPS: 0.4000 mg | ORAL_CAPSULE | Freq: Every day | ORAL | 3 refills | Status: AC

## 2024-05-11 MED ORDER — HYDROCHLOROTHIAZIDE 12.5 MG PO CAPS: 12.5000 mg | ORAL_CAPSULE | Freq: Every day | ORAL | 3 refills | Status: AC

## 2024-05-11 MED ORDER — NALTREXONE HCL 50 MG PO TABS: 50.0000 mg | ORAL_TABLET | Freq: Every day | ORAL | 3 refills | Status: AC

## 2024-05-11 MED ORDER — BUPROPION HCL ER (XL) 300 MG PO TB24: 300.0000 mg | ORAL_TABLET | Freq: Every day | ORAL | 3 refills | Status: AC

## 2024-05-11 MED ORDER — BENAZEPRIL HCL 40 MG PO TABS: 40.0000 mg | ORAL_TABLET | Freq: Every day | ORAL | 3 refills | Status: AC

## 2024-05-11 NOTE — Progress Notes (Signed)
 Patient ID: Fred [name], male   DOB: [birth year], 63 y.o.   MRN: [id number]         Chief Complaint:: wellness exam and alcohol abuse, BPH, low vit d, hyperglycemia, hld, htn,        HPI:  Fred [name] is a 63 y.o. male here for wellness exam; up to date                        Also s/p basal cell ca from forehead recently.  Drinks ETOH heavily on weekend still going to AA, has cravings but asking for naltrexone to help control.  Also has mild uinary stream slowing, straining and incomplete emptying.   Pt denies chest pain, increased sob or doe, wheezing, orthopnea, PND, increased LE swelling, palpitations, dizziness or syncope.   Pt denies polydipsia, polyuria, or new focal neuro s/s.  Gained several lbs, plans to be more active, have rejoined the gym.    Wt Readings from Last 3 Encounters:  05/11/24 209 lb (94.8 kg)  03/17/23 208 lb (94.3 kg)  01/29/22 203 lb (92.1 kg)   BP Readings from Last 3 Encounters:  05/11/24 136/84  03/17/23 124/72  01/29/22 118/72   Immunization History  Administered Date(s) Administered   Influenza Inj Mdck Quad Pf 09/08/2017   Influenza Split 10/08/2011, 08/25/2012   Influenza Whole 09/09/2008, 09/01/2009   Influenza, Quadrivalent, Recombinant, Inj, Pf 09/11/2019   Influenza, Seasonal, Injecte, Preservative Fre 07/26/2013   Influenza,inj,Quad PF,6+ Mos 09/01/2018, 09/19/2021   Influenza-Unspecified 10/25/2014, 09/25/2015, 08/25/2016, 09/11/2019, 09/14/2020, 10/05/2021, 09/12/2022   PFIZER(Purple Top)SARS-COV-2 Vaccination 02/17/2020, 03/13/2020, 10/31/2020, 09/19/2021   Pfizer Covid-19 Vaccine Bivalent Booster 45yrs & up 09/12/2022   Td 09/01/2009   Tdap 12/30/2019   Unspecified SARS-COV-2 Vaccination 03/01/2021, 09/07/2021   Zoster Recombinant(Shingrix) 02/21/2022, 06/05/2022   There are no preventive care reminders to display for this patient.     Past Medical History:  Diagnosis Date   ALCOHOL ABUSE, EPISODIC, HX OF 02/15/2008   ALLERGIC  RHINITIS 02/15/2008   DEPRESSION 07/28/2007   HEARING LOSS 08/05/2008   HYPERLIPIDEMIA 02/11/2008   HYPERTENSION 07/28/2007   PSA, INCREASED 10/01/2010   Substance abuse (HCC)    TRANSAMINASES, SERUM, ELEVATED 09/01/2009   Tubular adenoma of colon 10/25/2010   Past Surgical History:  Procedure Laterality Date   COLONOSCOPY     FOOT SURGERY Right    from injury   inguinal herniorrhapy Bilateral 1976   KNEE ARTHROSCOPY, MEDIAL PATELLO FEMORAL LIGAMENT RECONSTRUCTION W/ HAMSTRING GRAFT Left 2012   They cleaned cartilege out of my knee   left foot surgery  08/2007   POLYPECTOMY      reports that he has never smoked. He has never used smokeless tobacco. He reports that he does not drink alcohol and does not use drugs. family history includes Alcohol abuse in his mother; Colon cancer in his father and paternal grandmother; Colon cancer (age of onset: 57) in his brother; Colon polyps in his sister; Depression in his mother; Hypertension in his father; Kidney disease in his father; Prostate cancer in his father. Allergies  Allergen Reactions   Other Other (See Comments)   Current Outpatient Medications on File Prior to Visit  Medication Sig Dispense Refill   aspirin  81 MG chewable tablet Chew 1 tablet by mouth daily.     loratadine  (CLARITIN ) 10 MG tablet Take 10 mg by mouth daily.     methocarbamol (ROBAXIN) 500 MG tablet Take 500 mg by mouth at  bedtime.     naproxen (NAPROSYN) 500 MG tablet Take 500 mg by mouth as needed.     traMADol (ULTRAM) 50 MG tablet Take 50 mg by mouth as needed.     No current facility-administered medications on file prior to visit.        ROS:  All others reviewed and negative.  Objective        PE:  BP 136/84 (BP Location: Right Arm, Patient Position: Sitting, Cuff Size: Normal)   Pulse 78   Temp 98.9 F (37.2 C) (Oral)   Ht 6' (1.829 m)   Wt 209 lb (94.8 kg)   BMI 28.35 kg/m                 Constitutional: Pt appears in NAD               HENT:  Head: NCAT.                Right Ear: External ear normal.                 Left Ear: External ear normal.                Eyes: . Pupils are equal, round, and reactive to light. Conjunctivae and EOM are normal               Nose: without d/c or deformity               Neck: Neck supple. Gross normal ROM               Cardiovascular: Normal rate and regular rhythm.                 Pulmonary/Chest: Effort normal and breath sounds without rales or wheezing.                Abd:  Soft, NT, ND, + BS, no organomegaly               Neurological: Pt is alert. At baseline orientation, motor grossly intact               Skin: Skin is warm. No rashes, no other new lesions, LE edema - none               Psychiatric: Pt behavior is normal without agitation   Micro: none  Cardiac tracings I have personally interpreted today:  none  Pertinent Radiological findings (summarize): none   Lab Results  Component Value Date   WBC 6.7 03/25/2024   HGB 14.8 03/25/2024   HCT 44.5 03/25/2024   PLT 265.0 03/25/2024   GLUCOSE 93 03/25/2024   CHOL 150 03/25/2024   TRIG 76.0 03/25/2024   HDL 50.00 03/25/2024   LDLDIRECT 76.0 12/19/2017   LDLCALC 84 03/25/2024   ALT 76 (H) 03/25/2024   AST 41 (H) 03/25/2024   NA 141 03/25/2024   K 4.1 03/25/2024   CL 105 03/25/2024   CREATININE 0.91 03/25/2024   BUN 13 03/25/2024   CO2 27 03/25/2024   TSH 2.36 03/25/2024   PSA 10.81 (H) 03/25/2024   INR 1.0 08/07/2017   HGBA1C 5.5 03/25/2024   Assessment/Plan:  Fred [name] is a 63 y.o. White or Caucasian [1] male with  has a past medical history of ALCOHOL ABUSE, EPISODIC, HX OF (02/15/2008), ALLERGIC RHINITIS (02/15/2008), DEPRESSION (07/28/2007), HEARING LOSS (08/05/2008), HYPERLIPIDEMIA (02/11/2008), HYPERTENSION (07/28/2007), PSA, INCREASED (10/01/2010), Substance abuse (HCC), TRANSAMINASES, SERUM, ELEVATED (09/01/2009), and Tubular  adenoma of colon (10/25/2010).  Encounter for well adult exam with abnormal  findings Age and sex appropriate education and counseling updated with regular exercise and diet Referrals for preventative services - none needed Immunizations addressed - none needed Smoking counseling  - none needed Evidence for depression or other mood disorder - none significant Most recent labs reviewed. I have personally reviewed and have noted: 1) the patient's medical and social history 2) The patient's current medications and supplements 3) The patient's height, weight, and BMI have been recorded in the chart   Vitamin D  deficiency Last vitamin D  Lab Results  Component Value Date   VD25OH 39.66 03/25/2024   Low, to start oral replacement   Hyperglycemia Lab Results  Component Value Date   HGBA1C 5.5 03/25/2024   Stable, pt to continue current medical treatment  - diet, wt control   HLD (hyperlipidemia) Lab Results  Component Value Date   LDLCALC 84 03/25/2024   Stable, pt to continue current statin lipitor 40 qd   Essential hypertension BP Readings from Last 3 Encounters:  05/11/24 136/84  03/17/23 124/72  01/29/22 118/72   Stable, pt to continue medical treatment norvasc  5 every day, lotensin  40 every day, hct 12.5 qd   BPH (benign prostatic hyperplasia) With mild recent worsening - for flomax 1 qd  ALCOHOL ABUSE, EPISODIC, HX OF Pt requests daily naltrexone  Followup: Return in about 1 year (around 05/11/2025).  Rosalia Colonel, MD 05/13/2024 8:23 PM [facility]

## 2024-05-11 NOTE — Patient Instructions (Addendum)
 Please take all new medication as prescribed- the flomax, and the naltrexone  Please continue all other medications as before, and refills have been done if requested.  Please have the pharmacy call with any other refills you may need.  Please continue your efforts at being more active, low cholesterol diet, and weight control.  You are otherwise up to date with prevention measures today.  Please keep your appointments with your specialists as you may have planned  We can hold on lab testing today as you already had this done may 2025  Please make an Appointment to return for your 1 year visit, or sooner if needed, with Lab testing by Appointment as well, to be done about 3-5 days before at the FIRST FLOOR Lab (so this is for TWO appointments - please see the scheduling desk as you leave)

## 2024-05-13 ENCOUNTER — Encounter: Payer: Self-pay | Admitting: Internal Medicine

## 2024-05-13 NOTE — Assessment & Plan Note (Signed)
 Last vitamin D  Lab Results  Component Value Date   VD25OH 39.66 03/25/2024   Low, to start oral replacement

## 2024-05-13 NOTE — Assessment & Plan Note (Signed)

## 2024-05-13 NOTE — Assessment & Plan Note (Signed)
 With mild recent worsening - for flomax 1 qd

## 2024-05-13 NOTE — Assessment & Plan Note (Signed)
 Lab Results  Component Value Date   HGBA1C 5.5 03/25/2024   Stable, pt to continue current medical treatment  - diet, wt control

## 2024-05-13 NOTE — Assessment & Plan Note (Signed)
 Lab Results  Component Value Date   LDLCALC 84 03/25/2024   Stable, pt to continue current statin lipitor 40 qd

## 2024-05-13 NOTE — Assessment & Plan Note (Signed)
 BP Readings from Last 3 Encounters:  05/11/24 136/84  03/17/23 124/72  01/29/22 118/72   Stable, pt to continue medical treatment norvasc  5 every day, lotensin  40 every day, hct 12.5 qd

## 2024-05-13 NOTE — Assessment & Plan Note (Signed)
 Pt requests daily naltrexone

## 2024-05-31 DIAGNOSIS — Z79899 Other long term (current) drug therapy: Secondary | ICD-10-CM | POA: Diagnosis not present

## 2024-05-31 DIAGNOSIS — M5134 Other intervertebral disc degeneration, thoracic region: Secondary | ICD-10-CM | POA: Diagnosis not present

## 2024-06-05 DIAGNOSIS — J019 Acute sinusitis, unspecified: Secondary | ICD-10-CM | POA: Diagnosis not present

## 2024-06-05 DIAGNOSIS — J069 Acute upper respiratory infection, unspecified: Secondary | ICD-10-CM | POA: Diagnosis not present

## 2024-10-18 DIAGNOSIS — L738 Other specified follicular disorders: Secondary | ICD-10-CM | POA: Diagnosis not present

## 2024-10-18 DIAGNOSIS — Z85828 Personal history of other malignant neoplasm of skin: Secondary | ICD-10-CM | POA: Diagnosis not present

## 2024-10-18 DIAGNOSIS — L821 Other seborrheic keratosis: Secondary | ICD-10-CM | POA: Diagnosis not present

## 2024-10-18 DIAGNOSIS — L570 Actinic keratosis: Secondary | ICD-10-CM | POA: Diagnosis not present

## 2024-11-15 ENCOUNTER — Encounter: Payer: Self-pay | Admitting: Family Medicine

## 2025-01-20 ENCOUNTER — Encounter: Admitting: Family Medicine

## 2025-04-27 ENCOUNTER — Encounter: Admitting: Family Medicine

## 2025-05-12 ENCOUNTER — Encounter: Admitting: Internal Medicine
